# Patient Record
Sex: Male | Born: 1950 | Race: White | Hispanic: No | Marital: Married | Smoking: Never smoker
Health system: Southern US, Community
[De-identification: ages and names within clinical notes are randomized; demographics above are authoritative.]

---

## 1998-02-26 ENCOUNTER — Emergency Department (HOSPITAL_COMMUNITY): Admission: EM | Admit: 1998-02-26 | Discharge: 1998-02-26 | Payer: Self-pay | Admitting: Emergency Medicine

## 1998-04-03 ENCOUNTER — Encounter: Admission: RE | Admit: 1998-04-03 | Discharge: 1998-07-02 | Payer: Self-pay

## 2016-03-06 ENCOUNTER — Encounter: Payer: Self-pay | Admitting: General Practice

## 2016-04-26 ENCOUNTER — Ambulatory Visit: Payer: Self-pay | Admitting: Orthopedic Surgery

## 2016-05-06 ENCOUNTER — Ambulatory Visit: Payer: BC Managed Care – PPO | Admitting: Family Medicine

## 2016-05-15 ENCOUNTER — Inpatient Hospital Stay: Admit: 2016-05-15 | Payer: Self-pay | Admitting: Orthopedic Surgery

## 2016-05-15 SURGERY — ARTHROPLASTY, HIP, TOTAL, ANTERIOR APPROACH
Anesthesia: Choice | Laterality: Left

## 2017-12-25 ENCOUNTER — Inpatient Hospital Stay (HOSPITAL_COMMUNITY): Payer: Medicare Other

## 2017-12-25 ENCOUNTER — Inpatient Hospital Stay (HOSPITAL_COMMUNITY)
Admission: EM | Admit: 2017-12-25 | Discharge: 2018-01-21 | DRG: 308 | Disposition: E | Payer: Medicare Other | Attending: Pulmonary Disease | Admitting: Pulmonary Disease

## 2017-12-25 ENCOUNTER — Emergency Department (HOSPITAL_COMMUNITY): Payer: Medicare Other

## 2017-12-25 DIAGNOSIS — E1165 Type 2 diabetes mellitus with hyperglycemia: Secondary | ICD-10-CM | POA: Diagnosis present

## 2017-12-25 DIAGNOSIS — E872 Acidosis: Secondary | ICD-10-CM | POA: Diagnosis present

## 2017-12-25 DIAGNOSIS — I4901 Ventricular fibrillation: Secondary | ICD-10-CM | POA: Diagnosis present

## 2017-12-25 DIAGNOSIS — R402112 Coma scale, eyes open, never, at arrival to emergency department: Secondary | ICD-10-CM | POA: Diagnosis present

## 2017-12-25 DIAGNOSIS — G9341 Metabolic encephalopathy: Secondary | ICD-10-CM | POA: Diagnosis present

## 2017-12-25 DIAGNOSIS — R739 Hyperglycemia, unspecified: Secondary | ICD-10-CM | POA: Insufficient documentation

## 2017-12-25 DIAGNOSIS — Z66 Do not resuscitate: Secondary | ICD-10-CM | POA: Diagnosis present

## 2017-12-25 DIAGNOSIS — G40901 Epilepsy, unspecified, not intractable, with status epilepticus: Secondary | ICD-10-CM | POA: Diagnosis present

## 2017-12-25 DIAGNOSIS — J9690 Respiratory failure, unspecified, unspecified whether with hypoxia or hypercapnia: Secondary | ICD-10-CM

## 2017-12-25 DIAGNOSIS — E119 Type 2 diabetes mellitus without complications: Secondary | ICD-10-CM

## 2017-12-25 DIAGNOSIS — R570 Cardiogenic shock: Secondary | ICD-10-CM | POA: Diagnosis present

## 2017-12-25 DIAGNOSIS — E876 Hypokalemia: Secondary | ICD-10-CM | POA: Diagnosis present

## 2017-12-25 DIAGNOSIS — R402312 Coma scale, best motor response, none, at arrival to emergency department: Secondary | ICD-10-CM | POA: Diagnosis present

## 2017-12-25 DIAGNOSIS — J9600 Acute respiratory failure, unspecified whether with hypoxia or hypercapnia: Secondary | ICD-10-CM | POA: Diagnosis not present

## 2017-12-25 DIAGNOSIS — J9601 Acute respiratory failure with hypoxia: Secondary | ICD-10-CM | POA: Diagnosis present

## 2017-12-25 DIAGNOSIS — N179 Acute kidney failure, unspecified: Secondary | ICD-10-CM | POA: Diagnosis present

## 2017-12-25 DIAGNOSIS — Z515 Encounter for palliative care: Secondary | ICD-10-CM | POA: Diagnosis present

## 2017-12-25 DIAGNOSIS — Z4659 Encounter for fitting and adjustment of other gastrointestinal appliance and device: Secondary | ICD-10-CM

## 2017-12-25 DIAGNOSIS — R402212 Coma scale, best verbal response, none, at arrival to emergency department: Secondary | ICD-10-CM | POA: Diagnosis present

## 2017-12-25 DIAGNOSIS — D72829 Elevated white blood cell count, unspecified: Secondary | ICD-10-CM | POA: Diagnosis present

## 2017-12-25 DIAGNOSIS — G931 Anoxic brain damage, not elsewhere classified: Secondary | ICD-10-CM

## 2017-12-25 DIAGNOSIS — I469 Cardiac arrest, cause unspecified: Secondary | ICD-10-CM | POA: Diagnosis not present

## 2017-12-25 LAB — BASIC METABOLIC PANEL
ANION GAP: 14 (ref 5–15)
ANION GAP: 13 (ref 5–15)
BUN: 21 mg/dL — AB (ref 6–20)
BUN: 21 mg/dL — AB (ref 6–20)
CHLORIDE: 105 mmol/L (ref 101–111)
CHLORIDE: 106 mmol/L (ref 101–111)
CO2: 17 mmol/L — ABNORMAL LOW (ref 22–32)
CO2: 17 mmol/L — ABNORMAL LOW (ref 22–32)
Calcium: 8.2 mg/dL — ABNORMAL LOW (ref 8.9–10.3)
Calcium: 8.1 mg/dL — ABNORMAL LOW (ref 8.9–10.3)
Creatinine, Ser: 1.73 mg/dL — ABNORMAL HIGH (ref 0.61–1.24)
Creatinine, Ser: 1.41 mg/dL — ABNORMAL HIGH (ref 0.61–1.24)
GFR calc Af Amer: 46 mL/min — ABNORMAL LOW (ref >60)
GFR calc Af Amer: 59 mL/min — ABNORMAL LOW (ref >60)
GFR calc non Af Amer: 40 mL/min — ABNORMAL LOW (ref >60)
GFR calc non Af Amer: 51 mL/min — ABNORMAL LOW (ref >60)
GLUCOSE: 227 mg/dL — AB (ref 65–99)
Glucose, Bld: 242 mg/dL — ABNORMAL HIGH (ref 65–99)
POTASSIUM: 3.5 mmol/L (ref 3.5–5.1)
POTASSIUM: 3.2 mmol/L — AB (ref 3.5–5.1)
SODIUM: 136 mmol/L (ref 135–145)
SODIUM: 136 mmol/L (ref 135–145)

## 2017-12-25 LAB — APTT
APTT: 26 s (ref 24–36)
APTT: 27 s (ref 24–36)

## 2017-12-25 LAB — URINALYSIS, ROUTINE W REFLEX MICROSCOPIC
BILIRUBIN URINE: NEGATIVE
Glucose, UA: NEGATIVE mg/dL
Ketones, ur: NEGATIVE mg/dL
Leukocytes, UA: NEGATIVE
NITRITE: NEGATIVE
PROTEIN: NEGATIVE mg/dL
pH: 5 (ref 5.0–8.0)

## 2017-12-25 LAB — PROTIME-INR
INR: 1.03
INR: 1.03
Prothrombin Time: 13.4 seconds (ref 11.4–15.2)
Prothrombin Time: 13.4 seconds (ref 11.4–15.2)

## 2017-12-25 LAB — POCT I-STAT 3, ART BLOOD GAS (G3+)
Acid-base deficit: 9 mmol/L — ABNORMAL HIGH (ref 0.0–2.0)
Bicarbonate: 19 mmol/L — ABNORMAL LOW (ref 20.0–28.0)
O2 SAT: 97 %
PH ART: 7.241 — AB (ref 7.350–7.450)
TCO2: 20 mmol/L — ABNORMAL LOW (ref 22–32)
pCO2 arterial: 43.8 mmHg (ref 32.0–48.0)
pO2, Arterial: 101 mmHg (ref 83.0–108.0)

## 2017-12-25 LAB — URINALYSIS, MICROSCOPIC (REFLEX): WBC UA: NONE SEEN WBC/hpf (ref 0–5)

## 2017-12-25 LAB — CBC WITH DIFFERENTIAL/PLATELET
BASOS ABS: 0 10*3/uL (ref 0.0–0.1)
Basophils Relative: 0 %
Eosinophils Absolute: 0.1 10*3/uL (ref 0.0–0.7)
Eosinophils Relative: 1 %
HEMATOCRIT: 45.1 % (ref 39.0–52.0)
Hemoglobin: 15.1 g/dL (ref 13.0–17.0)
Lymphocytes Relative: 40 %
Lymphs Abs: 4.8 10*3/uL — ABNORMAL HIGH (ref 0.7–4.0)
MCH: 31.5 pg (ref 26.0–34.0)
MCHC: 33.5 g/dL (ref 30.0–36.0)
MCV: 94 fL (ref 78.0–100.0)
MONO ABS: 0.6 10*3/uL (ref 0.1–1.0)
Monocytes Relative: 5 %
NEUTROS ABS: 6.6 10*3/uL (ref 1.7–7.7)
Neutrophils Relative %: 54 %
PLATELETS: 238 10*3/uL (ref 150–400)
RBC: 4.8 MIL/uL (ref 4.22–5.81)
RDW: 13.5 % (ref 11.5–15.5)
WBC: 12.2 10*3/uL — ABNORMAL HIGH (ref 4.0–10.5)

## 2017-12-25 LAB — COMPREHENSIVE METABOLIC PANEL
ALT: 29 U/L (ref 17–63)
AST: 45 U/L — AB (ref 15–41)
Albumin: 2.9 g/dL — ABNORMAL LOW (ref 3.5–5.0)
Alkaline Phosphatase: 71 U/L (ref 38–126)
Anion gap: 15 (ref 5–15)
BILIRUBIN TOTAL: 0.9 mg/dL (ref 0.3–1.2)
BUN: 18 mg/dL (ref 6–20)
CHLORIDE: 111 mmol/L (ref 101–111)
CO2: 14 mmol/L — ABNORMAL LOW (ref 22–32)
Calcium: 7.1 mg/dL — ABNORMAL LOW (ref 8.9–10.3)
Creatinine, Ser: 1.74 mg/dL — ABNORMAL HIGH (ref 0.61–1.24)
GFR calc Af Amer: 46 mL/min — ABNORMAL LOW (ref >60)
GFR, EST NON AFRICAN AMERICAN: 39 mL/min — AB (ref >60)
Glucose, Bld: 193 mg/dL — ABNORMAL HIGH (ref 65–99)
POTASSIUM: 3.6 mmol/L (ref 3.5–5.1)
Sodium: 140 mmol/L (ref 135–145)
TOTAL PROTEIN: 4.6 g/dL — AB (ref 6.5–8.1)

## 2017-12-25 LAB — LIPID PANEL
CHOL/HDL RATIO: 4.2 ratio
CHOLESTEROL: 146 mg/dL (ref 0–200)
HDL: 35 mg/dL — ABNORMAL LOW (ref >40)
LDL Cholesterol: 75 mg/dL (ref 0–99)
TRIGLYCERIDES: 182 mg/dL — AB (ref <150)
VLDL: 36 mg/dL (ref 0–40)

## 2017-12-25 LAB — GLUCOSE, CAPILLARY
GLUCOSE-CAPILLARY: 230 mg/dL — AB (ref 65–99)
GLUCOSE-CAPILLARY: 240 mg/dL — AB (ref 65–99)
Glucose-Capillary: 145 mg/dL — ABNORMAL HIGH (ref 65–99)
Glucose-Capillary: 212 mg/dL — ABNORMAL HIGH (ref 65–99)

## 2017-12-25 LAB — I-STAT CHEM 8, ED
BUN: 20 mg/dL (ref 6–20)
CREATININE: 1.8 mg/dL — AB (ref 0.61–1.24)
Calcium, Ion: 1.07 mmol/L — ABNORMAL LOW (ref 1.15–1.40)
Chloride: 104 mmol/L (ref 101–111)
GLUCOSE: 226 mg/dL — AB (ref 65–99)
HEMATOCRIT: 47 % (ref 39.0–52.0)
HEMOGLOBIN: 16 g/dL (ref 13.0–17.0)
Potassium: 3.7 mmol/L (ref 3.5–5.1)
Sodium: 138 mmol/L (ref 135–145)
TCO2: 17 mmol/L — AB (ref 22–32)

## 2017-12-25 LAB — MAGNESIUM: Magnesium: 2.1 mg/dL (ref 1.7–2.4)

## 2017-12-25 LAB — POCT I-STAT, CHEM 8
BUN: 22 mg/dL — ABNORMAL HIGH (ref 6–20)
Calcium, Ion: 1.19 mmol/L (ref 1.15–1.40)
Chloride: 104 mmol/L (ref 101–111)
Creatinine, Ser: 1.6 mg/dL — ABNORMAL HIGH (ref 0.61–1.24)
Glucose, Bld: 235 mg/dL — ABNORMAL HIGH (ref 65–99)
HEMATOCRIT: 45 % (ref 39.0–52.0)
HEMOGLOBIN: 15.3 g/dL (ref 13.0–17.0)
Potassium: 3.9 mmol/L (ref 3.5–5.1)
SODIUM: 140 mmol/L (ref 135–145)
TCO2: 20 mmol/L — AB (ref 22–32)

## 2017-12-25 LAB — I-STAT TROPONIN, ED: TROPONIN I, POC: 0.07 ng/mL (ref 0.00–0.08)

## 2017-12-25 LAB — TROPONIN I
TROPONIN I: 0.59 ng/mL — AB (ref <0.03)
TROPONIN I: 0.48 ng/mL — AB (ref <0.03)
Troponin I: 0.06 ng/mL (ref <0.03)
Troponin I: 0.54 ng/mL (ref <0.03)

## 2017-12-25 LAB — LACTIC ACID, PLASMA: LACTIC ACID, VENOUS: 2.6 mmol/L — AB (ref 0.5–1.9)

## 2017-12-25 LAB — I-STAT CG4 LACTIC ACID, ED: LACTIC ACID, VENOUS: 8.4 mmol/L — AB (ref 0.5–1.9)

## 2017-12-25 LAB — BRAIN NATRIURETIC PEPTIDE: B NATRIURETIC PEPTIDE 5: 40.9 pg/mL (ref 0.0–100.0)

## 2017-12-25 LAB — MRSA PCR SCREENING: MRSA BY PCR: NEGATIVE

## 2017-12-25 LAB — TRIGLYCERIDES: TRIGLYCERIDES: 146 mg/dL (ref <150)

## 2017-12-25 MED ORDER — HEPARIN SODIUM (PORCINE) 5000 UNIT/ML IJ SOLN
INTRAMUSCULAR | Status: AC
  Filled 2017-12-25: qty 1

## 2017-12-25 MED ORDER — ASPIRIN 300 MG RE SUPP: 300.0000 mg | RECTAL | Status: AC

## 2017-12-25 MED ORDER — PROPOFOL 1000 MG/100ML IV EMUL
0.0000 ug/kg/min | INTRAVENOUS | Status: DC
  Administered 2017-12-25: 15 ug/kg/min via INTRAVENOUS

## 2017-12-25 MED ORDER — MIDAZOLAM HCL 2 MG/2ML IJ SOLN: 1.0000 mg | INTRAMUSCULAR | Status: DC | PRN

## 2017-12-25 MED ORDER — FENTANYL CITRATE (PF) 100 MCG/2ML IJ SOLN: 50.0000 ug | Freq: Once | INTRAMUSCULAR | Status: DC

## 2017-12-25 MED ORDER — FENTANYL BOLUS VIA INFUSION
25.0000 ug | INTRAVENOUS | Status: DC | PRN
  Filled 2017-12-25: qty 25

## 2017-12-25 MED ORDER — AMIODARONE LOAD VIA INFUSION
150.0000 mg | Freq: Once | INTRAVENOUS | Status: DC
  Filled 2017-12-25: qty 83.34

## 2017-12-25 MED ORDER — AMIODARONE HCL IN DEXTROSE 360-4.14 MG/200ML-% IV SOLN: 60.0000 mg/h | INTRAVENOUS | Status: DC

## 2017-12-25 MED ORDER — SODIUM CHLORIDE 0.9 % IV SOLN: INTRAVENOUS | Status: DC | PRN

## 2017-12-25 MED ORDER — FENTANYL 2500MCG IN NS 250ML (10MCG/ML) PREMIX INFUSION
100.0000 ug/h | INTRAVENOUS | Status: DC
  Administered 2017-12-25: 200 ug/h via INTRAVENOUS
  Administered 2017-12-26 (×2): 225 ug/h via INTRAVENOUS
  Administered 2017-12-27: 200 ug/h via INTRAVENOUS
  Administered 2017-12-28: 100 ug/h via INTRAVENOUS
  Filled 2017-12-25 (×5): qty 250

## 2017-12-25 MED ORDER — MIDAZOLAM HCL 2 MG/2ML IJ SOLN: 1.0000 mg | Freq: Once | INTRAMUSCULAR | Status: DC

## 2017-12-25 MED ORDER — HEPARIN SODIUM (PORCINE) 5000 UNIT/ML IJ SOLN: 60.0000 [IU]/kg | Freq: Once | INTRAMUSCULAR | Status: DC

## 2017-12-25 MED ORDER — ARTIFICIAL TEARS OPHTHALMIC OINT
1.0000 "application " | TOPICAL_OINTMENT | Freq: Three times a day (TID) | OPHTHALMIC | Status: DC
  Administered 2017-12-25 – 2017-12-27 (×5): 1 via OPHTHALMIC
  Filled 2017-12-25: qty 3.5

## 2017-12-25 MED ORDER — FENTANYL CITRATE (PF) 100 MCG/2ML IJ SOLN: 50.0000 ug | INTRAMUSCULAR | Status: DC | PRN

## 2017-12-25 MED ORDER — PANTOPRAZOLE SODIUM 40 MG IV SOLR
40.0000 mg | Freq: Every day | INTRAVENOUS | Status: DC
  Administered 2017-12-25 – 2017-12-27 (×3): 40 mg via INTRAVENOUS
  Filled 2017-12-25 (×3): qty 40

## 2017-12-25 MED ORDER — AMIODARONE HCL IN DEXTROSE 360-4.14 MG/200ML-% IV SOLN
60.0000 mg/h | INTRAVENOUS | Status: DC
  Administered 2017-12-25 (×2): 60 mg/h via INTRAVENOUS
  Filled 2017-12-25: qty 200

## 2017-12-25 MED ORDER — SODIUM CHLORIDE 0.9 % IV SOLN
INTRAVENOUS | Status: DC
  Administered 2017-12-25: 1.5 [IU]/h via INTRAVENOUS
  Filled 2017-12-25 (×2): qty 1

## 2017-12-25 MED ORDER — POTASSIUM CHLORIDE 10 MEQ/50ML IV SOLN
10.0000 meq | INTRAVENOUS | Status: AC
  Administered 2017-12-25 (×2): 10 meq via INTRAVENOUS
  Filled 2017-12-25 (×2): qty 50

## 2017-12-25 MED ORDER — INSULIN ASPART 100 UNIT/ML ~~LOC~~ SOLN: 0.0000 [IU] | Freq: Three times a day (TID) | SUBCUTANEOUS | Status: DC

## 2017-12-25 MED ORDER — SODIUM CHLORIDE 0.9 % IV SOLN
1.0000 ug/kg/min | INTRAVENOUS | Status: DC
  Administered 2017-12-25 – 2017-12-26 (×2): 1.2 ug/kg/min via INTRAVENOUS
  Filled 2017-12-25 (×2): qty 20

## 2017-12-25 MED ORDER — SODIUM CHLORIDE 0.9 % IV SOLN: INTRAVENOUS | Status: DC

## 2017-12-25 MED ORDER — AMIODARONE HCL IN DEXTROSE 360-4.14 MG/200ML-% IV SOLN
30.0000 mg/h | INTRAVENOUS | Status: DC
  Administered 2017-12-26: 30 mg/h via INTRAVENOUS
  Filled 2017-12-25: qty 200

## 2017-12-25 MED ORDER — SODIUM CHLORIDE 0.9% FLUSH
10.0000 mL | Freq: Two times a day (BID) | INTRAVENOUS | Status: DC
  Administered 2017-12-25 – 2017-12-28 (×6): 10 mL

## 2017-12-25 MED ORDER — MIDAZOLAM BOLUS VIA INFUSION
1.0000 mg | INTRAVENOUS | Status: DC | PRN
  Filled 2017-12-25: qty 1

## 2017-12-25 MED ORDER — CHLORHEXIDINE GLUCONATE CLOTH 2 % EX PADS
6.0000 | MEDICATED_PAD | Freq: Every day | CUTANEOUS | Status: DC
  Administered 2017-12-26 – 2017-12-28 (×3): 6 via TOPICAL

## 2017-12-25 MED ORDER — CISATRACURIUM BOLUS VIA INFUSION
0.1000 mg/kg | Freq: Once | INTRAVENOUS | Status: AC
  Administered 2017-12-25: 11.3 mg via INTRAVENOUS
  Filled 2017-12-25: qty 12

## 2017-12-25 MED ORDER — HEPARIN BOLUS VIA INFUSION
2500.0000 [IU] | Freq: Once | INTRAVENOUS | Status: AC
  Administered 2017-12-25: 2500 [IU] via INTRAVENOUS
  Filled 2017-12-25: qty 2500

## 2017-12-25 MED ORDER — NOREPINEPHRINE BITARTRATE 1 MG/ML IV SOLN
0.0000 ug/min | INTRAVENOUS | Status: DC
  Administered 2017-12-25: 5 ug/min via INTRAVENOUS
  Administered 2017-12-26: 16 ug/min via INTRAVENOUS
  Filled 2017-12-25 (×2): qty 4

## 2017-12-25 MED ORDER — SODIUM CHLORIDE 0.9 % IV SOLN
INTRAVENOUS | Status: DC
  Administered 2017-12-25: 19:00:00 via INTRAVENOUS

## 2017-12-25 MED ORDER — ASPIRIN 300 MG RE SUPP
300.0000 mg | RECTAL | Status: AC
  Administered 2017-12-25: 300 mg via RECTAL
  Filled 2017-12-25: qty 1

## 2017-12-25 MED ORDER — HEPARIN (PORCINE) IN NACL 100-0.45 UNIT/ML-% IJ SOLN
1000.0000 [IU]/h | INTRAMUSCULAR | Status: DC
  Administered 2017-12-25: 800 [IU]/h via INTRAVENOUS
  Administered 2017-12-26 – 2017-12-27 (×2): 1000 [IU]/h via INTRAVENOUS
  Filled 2017-12-25 (×3): qty 250

## 2017-12-25 MED ORDER — SODIUM CHLORIDE 0.9 % IV SOLN
2.0000 mg/h | INTRAVENOUS | Status: DC
  Administered 2017-12-25 – 2017-12-26 (×2): 2 mg/h via INTRAVENOUS
  Filled 2017-12-25 (×2): qty 10

## 2017-12-25 MED ORDER — PROPOFOL 1000 MG/100ML IV EMUL
INTRAVENOUS | Status: AC
  Filled 2017-12-25: qty 100

## 2017-12-25 MED ORDER — CISATRACURIUM BOLUS VIA INFUSION
0.0500 mg/kg | INTRAVENOUS | Status: DC | PRN
  Filled 2017-12-25: qty 6

## 2017-12-25 MED ORDER — AMIODARONE HCL IN DEXTROSE 360-4.14 MG/200ML-% IV SOLN: 30.0000 mg/h | INTRAVENOUS | Status: DC

## 2017-12-25 MED ORDER — ORAL CARE MOUTH RINSE
15.0000 mL | OROMUCOSAL | Status: DC
  Administered 2017-12-26 – 2017-12-28 (×25): 15 mL via OROMUCOSAL

## 2017-12-25 MED ORDER — CHLORHEXIDINE GLUCONATE 0.12% ORAL RINSE (MEDLINE KIT)
15.0000 mL | Freq: Two times a day (BID) | OROMUCOSAL | Status: DC
  Administered 2017-12-25 – 2017-12-28 (×6): 15 mL via OROMUCOSAL

## 2017-12-25 MED ORDER — SODIUM CHLORIDE 0.9% FLUSH: 10.0000 mL | INTRAVENOUS | Status: DC | PRN

## 2017-12-25 MED ORDER — ASPIRIN 81 MG PO CHEW: 324.0000 mg | CHEWABLE_TABLET | Freq: Once | ORAL | Status: DC

## 2017-12-25 NOTE — ED Triage Notes (Signed)
See code narrator 

## 2017-12-25 NOTE — Plan of Care (Signed)
  Problem: Education: Goal: Knowledge of General Education information will improve Outcome: Progressing   Problem: Health Behavior/Discharge Planning: Goal: Ability to manage health-related needs will improve Outcome: Progressing   Problem: Clinical Measurements: Goal: Ability to maintain clinical measurements within normal limits will improve Outcome: Progressing Goal: Will remain free from infection Outcome: Progressing Goal: Diagnostic test results will improve Outcome: Progressing Goal: Respiratory complications will improve Outcome: Progressing Goal: Cardiovascular complication will be avoided Outcome: Progressing   Problem: Activity: Goal: Risk for activity intolerance will decrease Outcome: Progressing   Problem: Coping: Goal: Level of anxiety will decrease Outcome: Progressing   Problem: Elimination: Goal: Will not experience complications related to bowel motility Outcome: Progressing Goal: Will not experience complications related to urinary retention Outcome: Progressing   Problem: Pain Managment: Goal: General experience of comfort will improve Outcome: Progressing   Problem: Safety: Goal: Ability to remain free from injury will improve Outcome: Progressing   Problem: Skin Integrity: Goal: Risk for impaired skin integrity will decrease Outcome: Progressing   Problem: Education: Goal: Ability to manage disease process will improve Outcome: Progressing   Problem: Cardiac: Goal: Ability to achieve and maintain adequate cardiopulmonary perfusion will improve Outcome: Progressing   Problem: Neurologic: Goal: Promote progressive neurologic recovery Outcome: Progressing   Problem: Skin Integrity: Goal: Risk for impaired skin integrity will be minimized. Outcome: Progressing

## 2017-12-25 NOTE — Progress Notes (Signed)
Notified ELink of troponin 0.54, K 3.5, and two orders for maintenance fluids. New orders given.

## 2017-12-25 NOTE — ED Notes (Signed)
Paged Stemi Cards per MD Roger Shelterong

## 2017-12-25 NOTE — Code Documentation (Signed)
CCM resident at bedside

## 2017-12-25 NOTE — ED Provider Notes (Signed)
[facility] Provider Note   CSN: [id number] Arrival date & time: 12/27/2017  1637     History   Chief Complaint Chief Complaint  Patient presents with  . post CPR    HPI Devin [name] is a 67 y.o. male.  The history is provided by the patient.  Cardiac Arrest  Witnessed by:  Not witnessed Incident location:  Home Time since incident:  25 minutes Time before BLS initiated: 3. Time before ALS initiated:  5-8 minutes Condition upon EMS arrival:  Unresponsive Pulse:  Absent Initial cardiac rhythm per EMS:  PEA Treatments prior to arrival:  ACLS protocol, AED discharged, intubation, CCR and vascular access Medications given prior to ED:  Epinephrine Defibrillation prior to arrival:  300 J Number of shocks delivered:  2 Defibrillation successful: yes   Rhythm after defibrillation:  Normal sinus rhythm IV access type:  Peripheral and intraosseous Airway:  Intubation prior to arrival Rhythm on admission to ED:  Normal sinus Associated symptoms: chest pain and shortness of breath   Risk factors: diabetes mellitus   reportedly 5d of CP and SOB leading up today  No past medical history on file.  Patient Active Problem List   Diagnosis Date Noted  . Cardiac arrest (HCC) 12/27/2017  . Diabetes (HCC) 01/14/2018    No past surgical history on file.      Home Medications    Prior to Admission medications   Not on File    Family History No family history on file.  Social History Social History   Tobacco Use  . Smoking status: Never Smoker  . Smokeless tobacco: Never Used  Substance Use Topics  . Alcohol use: Not on file  . Drug use: No     Allergies   Patient has no known allergies.   Review of Systems Review of Systems  Unable to perform ROS: Patient unresponsive  Respiratory: Positive for shortness of breath.   Cardiovascular: Positive for chest pain.  Level 5 caveat: Cardiac arrest   Physical  Exam Updated Vital Signs BP 124/74   Pulse 91   Temp (!) 96.9 F (36.1 C) (Temporal)   Resp 16   Ht 6' (1.829 m)   Wt 113.4 kg (250 lb)   SpO2 98%   BMI 33.91 kg/m   Physical Exam  Constitutional: He appears well-developed and well-nourished.  HENT:  Head: Normocephalic and atraumatic.  Eyes: Conjunctivae are normal.  3mm pupils, minimal reaction to light  Neck: Neck supple.  Cardiovascular: Normal rate, regular rhythm and intact distal pulses.  Pulmonary/Chest: Effort normal. No respiratory distress. He has wheezes.  ET tube in place.  Bilateral breath sounds present on arrival.  Abdominal: Soft. He exhibits no distension. There is no tenderness. There is no guarding.  Musculoskeletal: He exhibits no edema.  Neurological: He is unresponsive. GCS eye subscore is 1. GCS verbal subscore is 1. GCS motor subscore is 1.  No reaction to painful stimuli.  Patient does bite on the tube and is spontaneously breathing.  Skin: Skin is warm and dry.  Psychiatric: He has a normal mood and affect.  Nursing note and vitals reviewed.    ED Treatments / Results  Labs (all labs ordered are listed, but only abnormal results are displayed) Labs Reviewed  CBC WITH DIFFERENTIAL/PLATELET - Abnormal; Notable for the following components:      Result Value   WBC 12.2 (*)    Lymphs Abs 4.8 (*)    All other  components within normal limits  I-STAT CHEM 8, ED - Abnormal; Notable for the following components:   Creatinine, Ser 1.80 (*)    Glucose, Bld 226 (*)    Calcium, Ion 1.07 (*)    TCO2 17 (*)    All other components within normal limits  I-STAT CG4 LACTIC ACID, ED - Abnormal; Notable for the following components:   Lactic Acid, Venous 8.40 (*)    All other components within normal limits  PROTIME-INR  APTT  COMPREHENSIVE METABOLIC PANEL  LIPID PANEL  MAGNESIUM  TROPONIN I  TROPONIN I  TROPONIN I  BRAIN NATRIURETIC PEPTIDE  URINALYSIS, ROUTINE W REFLEX MICROSCOPIC  BLOOD GAS,  ARTERIAL  TRIGLYCERIDES  HIV ANTIBODY (ROUTINE TESTING)  TROPONIN I  TROPONIN I  TROPONIN I  BASIC METABOLIC PANEL  BASIC METABOLIC PANEL  BASIC METABOLIC PANEL  BASIC METABOLIC PANEL  BASIC METABOLIC PANEL  BASIC METABOLIC PANEL  BASIC METABOLIC PANEL  PROTIME-INR  PROTIME-INR  APTT  APTT  BLOOD GAS, ARTERIAL  BLOOD GAS, ARTERIAL  I-STAT TROPONIN, ED  I-STAT ARTERIAL BLOOD GAS, ED    EKG EKG Interpretation  Date/Time:  Thursday December 25 2017 16:50:15 EDT Ventricular Rate:  99 PR Interval:    QRS Duration: 118 QT Interval:  369 QTC Calculation: 474 R Axis:   15 Text Interpretation:  Sinus rhythm Incomplete right bundle branch block No acute changes no new changes - posterior EKG Confirmed by Derwood Kaplan ([id number]) on 12/27/2017 5:32:53 PM   Radiology Dg Chest Port 1 View  Result Date: 12/26/2017 CLINICAL DATA:  67 y/o  M; post cardiac arrest.  ET tube placement. EXAM: PORTABLE CHEST 1 VIEW COMPARISON:  None. FINDINGS: Stable cardiac silhouette given projection and technique. Low lung volumes. No consolidation, effusion, or pneumothorax identified. Endotracheal tube tip projects 3.7 cm above the carina. Left lung partially obscured by transcutaneous pacing pads. No acute osseous abnormality identified. IMPRESSION: Endotracheal tube 3.7 cm above the carina. Low lung volumes. No acute pulmonary process identified. Electronically Signed   By: Mitzi Hansen M.D.   On: 01/12/2018 17:02    Procedures .Central Line Date/Time: 01/15/2018 5:57 PM Performed by: Dwana Melena, DO Authorized by: Derwood Kaplan, MD   Consent:    Consent obtained:  Emergent situation Pre-procedure details:    Skin preparation:  ChloraPrep Procedure details:    Location:  R femoral   Site selection rationale:  In C collar and did not want to risk pneumothorax with subclavian CVC.   Patient position:  Flat   Procedural supplies:  Triple lumen   Landmarks identified: yes     Ultrasound  guidance: yes     Sterile ultrasound techniques: Sterile gel and sterile probe covers were used     Number of attempts:  1   Successful placement: yes   Post-procedure details:    Post-procedure:  Dressing applied and line sutured   Assessment:  Blood return through all ports and free fluid flow   Patient tolerance of procedure:  Tolerated well, no immediate complications   (including critical care time)  Medications Ordered in ED Medications  0.9 %  sodium chloride infusion (has no administration in time range)  amiodarone (NEXTERONE) 1.8 mg/mL load via infusion 150 mg (has no administration in time range)    Followed by  amiodarone (NEXTERONE PREMIX) 360-4.14 MG/200ML-% (1.8 mg/mL) IV infusion (60 mg/hr Intravenous New Bag/Given 12/30/2017 1653)    Followed by  amiodarone (NEXTERONE PREMIX) 360-4.14 MG/200ML-% (1.8 mg/mL) IV infusion (has no administration  in time range)  heparin 5000 UNIT/ML injection (has no administration in time range)  aspirin suppository 300 mg (has no administration in time range)  fentaNYL (SUBLIMAZE) injection 50 mcg (has no administration in time range)  fentaNYL (SUBLIMAZE) injection 50 mcg (has no administration in time range)  0.9 %  sodium chloride infusion (has no administration in time range)  propofol (DIPRIVAN) 1000 MG/100ML infusion (has no administration in time range)  propofol (DIPRIVAN) 1000 MG/100ML infusion (15 mcg/kg/min  113.4 kg Intravenous New Bag/Given 01/20/2018 1658)  midazolam (VERSED) injection 1 mg (has no administration in time range)  midazolam (VERSED) injection 1 mg (has no administration in time range)  0.9 %  sodium chloride infusion (has no administration in time range)  norepinephrine (LEVOPHED) 4 mg in dextrose 5 % 250 mL (0.016 mg/mL) infusion (has no administration in time range)  aspirin suppository 300 mg (has no administration in time range)  cisatracurium (NIMBEX) bolus via infusion 11.3 mg (has no administration in time  range)    And  cisatracurium (NIMBEX) 200 mg in sodium chloride 0.9 % 200 mL (1 mg/mL) infusion (has no administration in time range)    And  cisatracurium (NIMBEX) bolus via infusion 5.7 mg (has no administration in time range)  artificial tears (LACRILUBE) ophthalmic ointment 1 application (has no administration in time range)  0.9 %  sodium chloride infusion (has no administration in time range)  fentaNYL (SUBLIMAZE) injection 50 mcg (has no administration in time range)  fentaNYL in NS (108mcg/ml) infusion-PREMIX (has no administration in time range)  fentaNYL (SUBLIMAZE) bolus via infusion 25 mcg (has no administration in time range)  midazolam (VERSED) injection 1 mg (has no administration in time range)  midazolam (VERSED) 50 mg in sodium chloride 0.9 % 50 mL (1 mg/mL) infusion (has no administration in time range)  midazolam (VERSED) bolus via infusion 1 mg (has no administration in time range)  pantoprazole (PROTONIX) injection 40 mg (has no administration in time range)     Initial Impression / Assessment and Plan / ED Course  I have reviewed the triage vital signs and the nursing notes.  Pertinent labs & imaging results that were available during my care of the patient were reviewed by me and considered in my medical decision making (see chart for details).     Patient is a 67 year old male with history of diabetes who presents after cardiac arrest.  Patient received 2 defibrillations prior to arrival and 4 rounds of epinephrine.  Patient had return of spontaneous circulation prior to arrival.  He arrives in sinus rhythm with pulses present and bilateral breath sounds.  Patient was intubated prior to arrival.  Blood pressures are stable and normotensive on arrival.  Patient started on amiodarone due to reported ventricular fibrillation with EMS.  Patient did report 5 days of chest pain or shortness of breath prior to the arrest.  EKG initially shows global ST  depressions and ST elevation in aVR.  Patient does not have a STEMI.  I spoke with Dr. [place] with the cardiology STEMI team who does not recommend Cath Lab activation at this time.  Patient is placed on the cooling protocol due to the V. fib arrest.  ICU team agrees.  Chest x-ray confirmed ET tube placement.  CT head and CT cervical spine ordered.  Patient placed on propofol for sedation.  IV fluids given.  Respiratory therapy placed a left radial arterial line.  I placed a right femoral central line.  Patient  will be admitted to the ICU for post cardiac arrest care.  Final Clinical Impressions(s) / ED Diagnoses   Final diagnoses:  Encounter for orogastric (OG) tube placement  1. Cardiac arrest 2. Ventricular fibrillation  ED Discharge Orders    None       Dwana Melena, DO 12/22/2017 Theressa Millard, MD 12/27/17 1944

## 2017-12-25 NOTE — Code Documentation (Signed)
Pt to ED via [facility] from home with post CPR- pt has had 5 days of chest pain - pt was working inyard, collapsed

## 2017-12-25 NOTE — Consult Note (Signed)
Cardiology Consultation:   Patient ID: Devin [name]; [id number]; [birth year]   Admit date: 01/02/2018 Date of Consult: 01/14/2018  Primary Care Provider: No primary care provider on file. [facility]   Patient Profile:   Devin [name] is a 67 y.o. male with a reported hx of DM who is being seen today for the evaluation of out of hospital cardiac arrest,  at the request of Dr. Rhunette CroftNanavati, Emergency Medicine.  History of Present Illness:   Mr. Jefm BryantClemmons [name] has a reported h/o DM. A friend who was with him today reports that the patient has been complaining of intermittent chest pain for the past 5 days.   Per EMS report, Pt's friend was with him today and they were doing some work. The friend stepped a way for <5 min and when he returned, pt was down and unresponsive. EMS was called and bystander CPR was initiated. Upon EMS arrival, he was found to have a shockable rhythm and he received 300 J defibrillation. CPR continued and ROSC was achieved. [facility]. Post defibrillation EKGs shows lateral TWIs + AVR elevation. Pt is intubated and cooling protocol is being initiated. He will be [facility]. No plans for emergent cath at this time.   No past medical history on file.  No past surgical history on file.   Home Medications:  Prior to Admission medications   Not on File    Inpatient Medications: Scheduled Meds: . amiodarone  150 mg Intravenous Once  . artificial tears  1 application Both Eyes Q8H  . aspirin  300 mg Rectal NOW  . aspirin  300 mg Rectal NOW  . cisatracurium  0.1 mg/kg Intravenous Once  . fentaNYL (SUBLIMAZE) injection  50 mcg Intravenous Once  . heparin      . midazolam  1 mg Intravenous Once  . pantoprazole (PROTONIX) IV  40 mg Intravenous QHS   Continuous Infusions: . sodium chloride    . sodium chloride    . sodium chloride    . sodium chloride    . amiodarone 60 mg/hr (August 15, 2018 1653)   Followed by    . amiodarone    . cisatracurium (NIMBEX) infusion    . fentaNYL infusion INTRAVENOUS    . midazolam (VERSED) infusion    . norepinephrine (LEVOPHED) Adult infusion    . propofol    . propofol (DIPRIVAN) infusion 15 mcg/kg/min (August 15, 2018 1658)   PRN Meds: Place/Maintain arterial line **AND** sodium chloride, cisatracurium **AND** cisatracurium (NIMBEX) infusion **AND** cisatracurium, fentaNYL, fentaNYL (SUBLIMAZE) injection, fentaNYL (SUBLIMAZE) injection, midazolam, midazolam, midazolam  Allergies:   No Known Allergies  Social History:   Social History   Socioeconomic History  . Marital status: Married    Spouse name: Not on file  . Number of children: Not on file  . Years of education: Not on file  . Highest education level: Not on file  Occupational History  . Not on file  Social Needs  . Financial resource strain: Not on file  . Food insecurity:    Worry: Not on file    Inability: Not on file  . Transportation needs:    Medical: Not on file    Non-medical: Not on file  Tobacco Use  . Smoking status: Never Smoker  . Smokeless tobacco: Never Used  Substance and Sexual Activity  . Alcohol use: Not on file  . Drug use: No  . Sexual activity: Not on file  Lifestyle  .  Physical activity:    Days per week: Not on file    Minutes per session: Not on file  . Stress: Not on file  Relationships  . Social connections:    Talks on phone: Not on file    Gets together: Not on file    Attends religious service: Not on file    Active member of club or organization: Not on file    Attends meetings of clubs or organizations: Not on file    Relationship status: Not on file  . Intimate partner violence:    Fear of current or ex partner: Not on file    Emotionally abused: Not on file    Physically abused: Not on file    Forced sexual activity: Not on file  Other Topics Concern  . Not on file  Social History Narrative  . Not on file    Family History:   No family history  on file. - unable to obtain family history given pt status. Currently intubated and sedated. No family/ friends currently present   ROS:  Please see the history of present illness.   All other ROS reviewed and negative.     Physical Exam/Data:   Vitals:   2017-12-26 1643 Dec 26, 2017 1647 2017/12/26 1715  BP:   124/74  Pulse:   91  Resp:   16  Temp:   (!) 96.9 F (36.1 C)  TempSrc:   Temporal  SpO2: 98%  98%  Weight:  250 lb (113.4 kg)   Height: 5\' 11"  (1.803 m) 6' (1.829 m)    No intake or output data in the 24 hours ending Dec 26, 2017 1749 Filed Weights   2017/12/26 1647  Weight: 250 lb (113.4 kg)   Body mass index is 33.91 kg/m.  General:  Intubated and sedated HEENT: normal Lymph: no adenopathy Neck: no JVD Endocrine:  No thryomegaly Vascular: No carotid bruits; FA pulses 2+ bilaterally without bruits  Cardiac:  normal S1, S2; RRR; no murmur  Lungs:  clear to auscultation bilaterally, no wheezing, rhonchi or rales  Abd: soft, nontender, no hepatomegaly  Ext: no edema Musculoskeletal:  No deformities, BUE and BLE strength normal and equal Skin: warm and dry  Neuro:  Intubated and sedated Psych:  Intubated and sedated.  EKG:  The EKG was personally reviewed and demonstrates: Initial EKG- Sinus rhythm Posterior infarct, recent avR ST elevation, v2, v3 and inferior lead ST depression, reperfusion c hanges vs. MI  Telemetry:  Telemetry was personally reviewed and demonstrates:    Relevant CV Studies: W/u pending   Laboratory Data:  Chemistry Recent Labs  Lab December 26, 2017 1654  NA 138  K 3.7  CL 104  GLUCOSE 226*  BUN 20  CREATININE 1.80*    No results for input(s): PROT, ALBUMIN, AST, ALT, ALKPHOS, BILITOT in the last 168 hours. Hematology Recent Labs  Lab 12/26/2017 1640 12-26-17 1654  WBC 12.2*  --   RBC 4.80  --   HGB 15.1 16.0  HCT 45.1 47.0  MCV 94.0  --   MCH 31.5  --   MCHC 33.5  --   RDW 13.5  --   PLT 238  --    Cardiac EnzymesNo results for  input(s): TROPONINI in the last 168 hours.  Recent Labs  Lab 26-Dec-2017 1648  TROPIPOC 0.07    BNPNo results for input(s): BNP, PROBNP in the last 168 hours.  DDimer No results for input(s): DDIMER in the last 168 hours.  Radiology/Studies:  Dg Chest [facility]  1 View  Result Date: 2018-01-01 CLINICAL DATA:  67 y/o  M; post cardiac arrest.  ET tube placement. EXAM: PORTABLE CHEST 1 VIEW COMPARISON:  None. FINDINGS: Stable cardiac silhouette given projection and technique. Low lung volumes. No consolidation, effusion, or pneumothorax identified. Endotracheal tube tip projects 3.7 cm above the carina. Left lung partially obscured by transcutaneous pacing pads. No acute osseous abnormality identified. IMPRESSION: Endotracheal tube 3.7 cm above the carina. Low lung volumes. No acute pulmonary process identified. Electronically Signed   By: Mitzi Hansen M.D.   On: 2018/01/01 17:02    Assessment and Plan:   1. Cardiac Arrest: per EMS reports, friend of pt noted recent history of chest pain x 5 days. Also reported h/o DM, thus CAD likely. ROSC achieved with defibrillation and CPR. Post defibrillation EKGs showed sinus rhythm w/ avR ST elevation and v2, v3 and inferior lead ST depression. Repeat EKG showed SR with incomplete RBBB. Pt is currently being cooled and intubated and will be admitted to ICU by [facility]. No plans for immediate cath. Will cycle cardiac enzymes and can get echo at some point. LHC dependent on neurological recovery. MD to follow with further recs.    For questions or updates, please contact [facility] Please consult [email or web address] for contact info under Cardiology/STEMI.   Signed, Robbie Lis, PA-C  01-Jan-2018 5:49 PM

## 2017-12-25 NOTE — Code Documentation (Signed)
Ice packs placed for code cool

## 2017-12-25 NOTE — Procedures (Signed)
Arterial Catheter Insertion Procedure Note Devin SheldonRobert A [name] [name] [id number] [birth year]  Procedure: Insertion of Arterial Catheter  Indications: Blood pressure monitoring and Frequent blood sampling  Procedure Details Consent: Unable to obtain consent because of emergent medical necessity. Time Out: Verified patient identification, verified procedure, site/side was marked, verified correct patient position, special equipment/implants available, medications/allergies/relevent history reviewed, required imaging and test results available.  Performed  Maximum sterile technique was used including antiseptics, cap, gloves, gown, hand hygiene, mask and sheet. Skin prep: Chlorhexidine; local anesthetic administered 20 gauge catheter was inserted into left radial artery using the Seldinger technique. ULTRASOUND GUIDANCE USED: NO Evaluation Blood flow good; BP tracing good. Complications: No apparent complications.   Devin [name], Devin [name] Devin [name] 12/27/2017

## 2017-12-25 NOTE — Progress Notes (Addendum)
eLink Physician-Brief Progress Note Patient Name: Devin SheldonRobert A Clemmons [name] DOB: 12-31-1950 MRN: [id number]   Date of Service  01/08/2018  HPI/Events of Note  Hypokalemia  eICU Interventions  Replace K+        Devin [name] 01/09/2018, 9:14 PM

## 2017-12-25 NOTE — Progress Notes (Signed)
Spoke with ELink at 2200 after 2 CBGs > 200. Orders for SSI placed by MD. ICU Glycemic Control Phase 2 ordered by this RN after 2300 CBG > 200.

## 2017-12-25 NOTE — H&P (Signed)
PULMONARY / CRITICAL CARE MEDICINE   Name: Devin [name] MRN: [id number] DOB: 11/23/1950    ADMISSION DATE:  01/15/2018 CONSULTATION DATE:  01/18/2018  REFERRING MD: ED  CHIEF COMPLAINT:  Cardiac arrest  HISTORY OF PRESENT ILLNESS:   [name] is a 67 yr old male with no available past history coming in after witnessed cardiac arrest. He was working in the yard with his family were he collapsed and CPR started immediately and when EMS arrived he was shocked twice. He was down for around 23mins before ROSC. Upon arrival to the ED he was in sinus rhythm but GCS3. He was started shortly in sedation for biting on the tube.  I obviously could not take any Hx from the patient and most of the history was taken from staff.  PAST MEDICAL HISTORY :  He  has no past medical history on file. Could not be obtained due to altered mental status   PAST SURGICAL HISTORY: He  has no past surgical history on file.  No Known Allergies  No current facility-administered medications on file prior to encounter.    No current outpatient medications on file prior to encounter.    FAMILY HISTORY:  His has no family status information on file.    SOCIAL HISTORY: Could not be obtained due to altered mental status    REVIEW OF SYSTEMS:   Could not be obtained due to altered mental status and no family available     VITAL SIGNS: BP 124/74   Pulse 91   Temp (!) 96.9 F (36.1 C) (Temporal)   Resp 16   Ht 6' (1.829 m)   Wt 113.4 kg (250 lb)   SpO2 98%   BMI 33.91 kg/m   HEMODYNAMICS:    VENTILATOR SETTINGS: Vent Mode: PRVC FiO2 (%):  [100 %] 100 % Set Rate:  [16 bmp] 16 bmp Vt Set:  [600 mL] 600 mL PEEP:  [5 cmH20] 5 cmH20  INTAKE / OUTPUT: No intake/output data recorded.  PHYSICAL EXAMINATION: General: obtunded acutely ill  Neuro: pupils 4mm reactive HEENT:  Intubated orally  Cardiovascular:  Normal heart sounds no murmurs Lungs:  Clear no wheezing no crackles  Abdomen:  soft no tenderness Musculoskeletal:  No edema  Skin:  No rash LABS:  BMET Recent Labs  Lab 12/27/2017 1654  NA 138  K 3.7  CL 104  BUN 20  CREATININE 1.80*  GLUCOSE 226*    Electrolytes No results for input(s): CALCIUM, MG, PHOS in the last 168 hours.  CBC Recent Labs  Lab 01/14/2018 1640 01/13/2018 1654  WBC 12.2*  --   HGB 15.1 16.0  HCT 45.1 47.0  PLT 238  --     Coag's Recent Labs  Lab 01/06/2018 1640  APTT 26  INR 1.03    Sepsis Markers Recent Labs  Lab 01/12/2018 1656  LATICACIDVEN 8.40*    ABG No results for input(s): PHART, PCO2ART, PO2ART in the last 168 hours.  Liver Enzymes No results for input(s): AST, ALT, ALKPHOS, BILITOT, ALBUMIN in the last 168 hours.  Cardiac Enzymes No results for input(s): TROPONINI, PROBNP in the last 168 hours.  Glucose No results for input(s): GLUCAP in the last 168 hours.  Imaging Dg Chest Port 1 View  Result Date: 12/24/2017 CLINICAL DATA:  67 y/o  M; post cardiac arrest.  ET tube placement. EXAM: PORTABLE CHEST 1 VIEW COMPARISON:  None. FINDINGS: Stable cardiac silhouette given projection and technique. Low lung volumes. No consolidation, effusion, or  pneumothorax identified. Endotracheal tube tip projects 3.7 cm above the carina. Left lung partially obscured by transcutaneous pacing pads. No acute osseous abnormality identified. IMPRESSION: Endotracheal tube 3.7 cm above the carina. Low lung volumes. No acute pulmonary process identified. Electronically Signed   By: Mitzi Hansen M.D.   On: 12/23/2017 17:02     STUDIES:  Ct head >>>   SIGNIFICANT EVENTS: Cardiac arrest 01/10/2018   LINES/TUBES: Right femoral line 4/4  Arterial line 4/4  DISCUSSION: [name] 67 yr old male with witnessed ventricular fibrillation arrest and acute hypoxemic respiratory failure requiring mechanical ventilation    ASSESSMENT / PLAN:  PULMONARY A: Acute hypoxemic resp failure requiring mechanical ventilation    P:   Vent protocol/bundle - daily CXR - 6cc/kg Vt  CARDIOVASCULAR A:  V fib arrest   P:  Start hypothermia protocol  Amiodarone drip  Cardiology consulted no cath for now Trend cardiac enzymes  Repeat lactic acid    RENAL A:   AKI  P:   Follow renal function   GASTROINTESTINAL A:    P:   NPO PPI for GI prophylaxis   HEMATOLOGIC A:   Leukocytosis  P:  Possible reactive   INFECTIOUS A:   No signs of infections  P:     ENDOCRINE A:   hyperglycemia P:   SSI  NEUROLOGIC A:   Acute metabolic encephalopathy  Rule out anoxic injury  P:   - stat CT head   RASS goal: -2  I have spent 45 mins of CC time bedside or in the unit exclusive of billable procedures   FAMILY  - Updates: none available   - Inter-disciplinary family meet or Palliative Care meeting due by:  12/31/2017   Pulmonary and Critical Care Medicine [facility] Pager: [phone]  12/26/2017, 5:55 PM

## 2017-12-25 NOTE — Progress Notes (Signed)
eLink Physician-Brief Progress Note Patient Name: Devin SheldonRobert A Clemmons [name] DOB: [birth year] MRN: [id number]   Date of Service  12/24/2017  HPI/Events of Note  hyperglycemia  eICU Interventions  Humalog Insulin sliding scale regimen        Migdalia DkOkoronkwo U Ogan 01/02/2018, 10:06 PM

## 2017-12-25 NOTE — Progress Notes (Signed)
Pt transported to CT for procedure and 2H unit via vent with current settings. Ambu bag and mask at bedside during transport. ETT secure and intact, pt tol well. Report given to RealitosJessie, RCP. No complications.

## 2017-12-25 NOTE — Progress Notes (Signed)
ANTICOAGULATION CONSULT NOTE - Initial Consult  Pharmacy Consult for heparin Indication: chest pain/ACS  No Known Allergies  Patient Measurements: Height: 6' (182.9 cm) Weight: 248 lb 0.3 oz (112.5 kg)(-2.39 for arctic pads) IBW/kg (Calculated) : 77.6 Heparin Dosing Weight: 100 kg   Vital Signs: Temp: 96.9 F (36.1 C) (04/04 1715) Temp Source: Temporal (04/04 1715) BP: 124/74 (04/04 1715) Pulse Rate: 91 (04/04 1715)  Labs: Recent Labs    Sep 28, 2017 1640 Sep 28, 2017 1654  HGB 15.1 16.0  HCT 45.1 47.0  PLT 238  --   APTT 26  --   LABPROT 13.4  --   INR 1.03  --   CREATININE 1.74* 1.80*  TROPONINI 0.06*  --     Estimated Creatinine Clearance: 53 mL/min (A) (by C-G formula based on SCr of 1.8 mg/dL (H)).  Assessment: [age] YOM post cardiac arrest on [place] cooling protocol, pharmacy is consulted to start IV heparin for ACS, pt with mild troponin elevation and ST elevation. Head-CT no bleeding. Pending neurologic improvement to make decision for cath.  hgb 15.1, pltc 238K. baseline aPTT, PT/INR wnl.   Goal of Therapy:  Heparin level 0.3-0.7 units/ml Monitor platelets by anticoagulation protocol: Yes   Plan:  - Heparin bolus 2500 units - Heparin infusion 800 units/hr - f/u 6 hr heparin level at 0300 - f/u rewarming and plan for cath  Bayard HuggerMei Shavonn Convey, PharmD, BCPS  Clinical Pharmacist  Pager: [phone]   01/07/2018,6:42 PM

## 2017-12-26 ENCOUNTER — Inpatient Hospital Stay (HOSPITAL_COMMUNITY): Payer: Medicare Other

## 2017-12-26 DIAGNOSIS — I469 Cardiac arrest, cause unspecified: Secondary | ICD-10-CM

## 2017-12-26 DIAGNOSIS — G40901 Epilepsy, unspecified, not intractable, with status epilepticus: Secondary | ICD-10-CM

## 2017-12-26 DIAGNOSIS — J9600 Acute respiratory failure, unspecified whether with hypoxia or hypercapnia: Secondary | ICD-10-CM

## 2017-12-26 DIAGNOSIS — N179 Acute kidney failure, unspecified: Secondary | ICD-10-CM

## 2017-12-26 LAB — POCT I-STAT, CHEM 8
BUN: 21 mg/dL — ABNORMAL HIGH (ref 6–20)
Calcium, Ion: 1.05 mmol/L — ABNORMAL LOW (ref 1.15–1.40)
Chloride: 105 mmol/L (ref 101–111)
Creatinine, Ser: 1.1 mg/dL (ref 0.61–1.24)
Glucose, Bld: 160 mg/dL — ABNORMAL HIGH (ref 65–99)
HEMATOCRIT: 47 % (ref 39.0–52.0)
HEMOGLOBIN: 16 g/dL (ref 13.0–17.0)
POTASSIUM: 3.5 mmol/L (ref 3.5–5.1)
SODIUM: 138 mmol/L (ref 135–145)
TCO2: 16 mmol/L — AB (ref 22–32)

## 2017-12-26 LAB — BASIC METABOLIC PANEL
ANION GAP: 10 (ref 5–15)
ANION GAP: 12 (ref 5–15)
ANION GAP: 15 (ref 5–15)
ANION GAP: 15 (ref 5–15)
Anion gap: 16 — ABNORMAL HIGH (ref 5–15)
BUN: 20 mg/dL (ref 6–20)
BUN: 21 mg/dL — ABNORMAL HIGH (ref 6–20)
BUN: 20 mg/dL (ref 6–20)
BUN: 20 mg/dL (ref 6–20)
BUN: 18 mg/dL (ref 6–20)
CALCIUM: 7.4 mg/dL — AB (ref 8.9–10.3)
CHLORIDE: 106 mmol/L (ref 101–111)
CHLORIDE: 107 mmol/L (ref 101–111)
CHLORIDE: 108 mmol/L (ref 101–111)
CO2: 14 mmol/L — AB (ref 22–32)
CO2: 14 mmol/L — ABNORMAL LOW (ref 22–32)
CO2: 14 mmol/L — ABNORMAL LOW (ref 22–32)
CO2: 17 mmol/L — AB (ref 22–32)
CO2: 17 mmol/L — ABNORMAL LOW (ref 22–32)
CREATININE: 1.37 mg/dL — AB (ref 0.61–1.24)
CREATININE: 1.33 mg/dL — AB (ref 0.61–1.24)
CREATININE: 1.34 mg/dL — AB (ref 0.61–1.24)
Calcium: 7.9 mg/dL — ABNORMAL LOW (ref 8.9–10.3)
Calcium: 7.9 mg/dL — ABNORMAL LOW (ref 8.9–10.3)
Calcium: 7.8 mg/dL — ABNORMAL LOW (ref 8.9–10.3)
Calcium: 7.4 mg/dL — ABNORMAL LOW (ref 8.9–10.3)
Chloride: 107 mmol/L (ref 101–111)
Chloride: 108 mmol/L (ref 101–111)
Creatinine, Ser: 1.46 mg/dL — ABNORMAL HIGH (ref 0.61–1.24)
Creatinine, Ser: 1.45 mg/dL — ABNORMAL HIGH (ref 0.61–1.24)
GFR calc non Af Amer: 49 mL/min — ABNORMAL LOW (ref >60)
GFR calc non Af Amer: 49 mL/min — ABNORMAL LOW (ref >60)
GFR calc non Af Amer: 53 mL/min — ABNORMAL LOW (ref >60)
GFR calc non Af Amer: 55 mL/min — ABNORMAL LOW (ref >60)
GFR, EST AFRICAN AMERICAN: 56 mL/min — AB (ref >60)
GFR, EST AFRICAN AMERICAN: 57 mL/min — AB (ref >60)
GFR, EST NON AFRICAN AMERICAN: 54 mL/min — AB (ref >60)
Glucose, Bld: 242 mg/dL — ABNORMAL HIGH (ref 65–99)
Glucose, Bld: 186 mg/dL — ABNORMAL HIGH (ref 65–99)
Glucose, Bld: 150 mg/dL — ABNORMAL HIGH (ref 65–99)
Glucose, Bld: 197 mg/dL — ABNORMAL HIGH (ref 65–99)
Glucose, Bld: 157 mg/dL — ABNORMAL HIGH (ref 65–99)
POTASSIUM: 3.4 mmol/L — AB (ref 3.5–5.1)
POTASSIUM: 3.4 mmol/L — AB (ref 3.5–5.1)
POTASSIUM: 3.7 mmol/L (ref 3.5–5.1)
Potassium: 3.3 mmol/L — ABNORMAL LOW (ref 3.5–5.1)
Potassium: 3.8 mmol/L (ref 3.5–5.1)
SODIUM: 136 mmol/L (ref 135–145)
SODIUM: 135 mmol/L (ref 135–145)
SODIUM: 137 mmol/L (ref 135–145)
SODIUM: 136 mmol/L (ref 135–145)
Sodium: 136 mmol/L (ref 135–145)

## 2017-12-26 LAB — HEPARIN LEVEL (UNFRACTIONATED)
HEPARIN UNFRACTIONATED: 0.39 [IU]/mL (ref 0.30–0.70)
Heparin Unfractionated: 0.27 IU/mL — ABNORMAL LOW (ref 0.30–0.70)

## 2017-12-26 LAB — CBC
HCT: 48.6 % (ref 39.0–52.0)
HEMOGLOBIN: 16.7 g/dL (ref 13.0–17.0)
MCH: 32.1 pg (ref 26.0–34.0)
MCHC: 34.4 g/dL (ref 30.0–36.0)
MCV: 93.3 fL (ref 78.0–100.0)
Platelets: 229 10*3/uL (ref 150–400)
RBC: 5.21 MIL/uL (ref 4.22–5.81)
RDW: 13.2 % (ref 11.5–15.5)
WBC: 19.7 10*3/uL — ABNORMAL HIGH (ref 4.0–10.5)

## 2017-12-26 LAB — POCT I-STAT 3, ART BLOOD GAS (G3+)
Acid-base deficit: 14 mmol/L — ABNORMAL HIGH (ref 0.0–2.0)
Acid-base deficit: 15 mmol/L — ABNORMAL HIGH (ref 0.0–2.0)
Acid-base deficit: 13 mmol/L — ABNORMAL HIGH (ref 0.0–2.0)
BICARBONATE: 14.6 mmol/L — AB (ref 20.0–28.0)
BICARBONATE: 15.9 mmol/L — AB (ref 20.0–28.0)
Bicarbonate: 13.7 mmol/L — ABNORMAL LOW (ref 20.0–28.0)
O2 SAT: 86 %
O2 SAT: 92 %
O2 Saturation: 93 %
PCO2 ART: 27.3 mmHg — AB (ref 32.0–48.0)
PH ART: 7.104 — AB (ref 7.350–7.450)
PO2 ART: 68 mmHg — AB (ref 83.0–108.0)
PO2 ART: 61 mmHg — AB (ref 83.0–108.0)
Patient temperature: 33.1
TCO2: 16 mmol/L — ABNORMAL LOW (ref 22–32)
TCO2: 18 mmol/L — ABNORMAL LOW (ref 22–32)
TCO2: 15 mmol/L — ABNORMAL LOW (ref 22–32)
pCO2 arterial: 44.4 mmHg (ref 32.0–48.0)
pCO2 arterial: 47.8 mmHg (ref 32.0–48.0)
pH, Arterial: 7.125 — CL (ref 7.350–7.450)
pH, Arterial: 7.287 — ABNORMAL LOW (ref 7.350–7.450)
pO2, Arterial: 71 mmHg — ABNORMAL LOW (ref 83.0–108.0)

## 2017-12-26 LAB — BLOOD GAS, ARTERIAL
ACID-BASE DEFICIT: 11.7 mmol/L — AB (ref 0.0–2.0)
Acid-base deficit: 11.6 mmol/L — ABNORMAL HIGH (ref 0.0–2.0)
BICARBONATE: 15 mmol/L — AB (ref 20.0–28.0)
Bicarbonate: 14.5 mmol/L — ABNORMAL LOW (ref 20.0–28.0)
FIO2: 70
FIO2: 60
LHR: 14 {breaths}/min
MECHVT: 600 mL
MECHVT: 600 mL
O2 Saturation: 93.1 %
O2 Saturation: 94.4 %
PATIENT TEMPERATURE: 98.6
PEEP/CPAP: 5 cmH2O
PEEP: 5 cmH2O
PH ART: 7.18 — AB (ref 7.350–7.450)
PO2 ART: 74.9 mmHg — AB (ref 83.0–108.0)
Patient temperature: 98.6
RATE: 22 resp/min
pCO2 arterial: 41.9 mmHg (ref 32.0–48.0)
pCO2 arterial: 35.9 mmHg (ref 32.0–48.0)
pH, Arterial: 7.23 — ABNORMAL LOW (ref 7.350–7.450)
pO2, Arterial: 72.6 mmHg — ABNORMAL LOW (ref 83.0–108.0)

## 2017-12-26 LAB — GLUCOSE, CAPILLARY
GLUCOSE-CAPILLARY: 137 mg/dL — AB (ref 65–99)
GLUCOSE-CAPILLARY: 148 mg/dL — AB (ref 65–99)
GLUCOSE-CAPILLARY: 150 mg/dL — AB (ref 65–99)
GLUCOSE-CAPILLARY: 150 mg/dL — AB (ref 65–99)
GLUCOSE-CAPILLARY: 151 mg/dL — AB (ref 65–99)
GLUCOSE-CAPILLARY: 154 mg/dL — AB (ref 65–99)
GLUCOSE-CAPILLARY: 156 mg/dL — AB (ref 65–99)
GLUCOSE-CAPILLARY: 161 mg/dL — AB (ref 65–99)
GLUCOSE-CAPILLARY: 206 mg/dL — AB (ref 65–99)
GLUCOSE-CAPILLARY: 230 mg/dL — AB (ref 65–99)
GLUCOSE-CAPILLARY: 99 mg/dL (ref 65–99)
Glucose-Capillary: 146 mg/dL — ABNORMAL HIGH (ref 65–99)
Glucose-Capillary: 146 mg/dL — ABNORMAL HIGH (ref 65–99)
Glucose-Capillary: 146 mg/dL — ABNORMAL HIGH (ref 65–99)
Glucose-Capillary: 159 mg/dL — ABNORMAL HIGH (ref 65–99)
Glucose-Capillary: 165 mg/dL — ABNORMAL HIGH (ref 65–99)
Glucose-Capillary: 182 mg/dL — ABNORMAL HIGH (ref 65–99)
Glucose-Capillary: 187 mg/dL — ABNORMAL HIGH (ref 65–99)
Glucose-Capillary: 191 mg/dL — ABNORMAL HIGH (ref 65–99)
Glucose-Capillary: 237 mg/dL — ABNORMAL HIGH (ref 65–99)
Glucose-Capillary: 99 mg/dL (ref 65–99)

## 2017-12-26 LAB — TROPONIN I
Troponin I: 0.31 ng/mL (ref <0.03)
Troponin I: 0.2 ng/mL (ref <0.03)

## 2017-12-26 LAB — APTT: aPTT: 59 seconds — ABNORMAL HIGH (ref 24–36)

## 2017-12-26 LAB — HIV ANTIBODY (ROUTINE TESTING W REFLEX): HIV SCREEN 4TH GENERATION: NONREACTIVE

## 2017-12-26 LAB — HEMOGLOBIN A1C
Hgb A1c MFr Bld: 5.5 % (ref 4.8–5.6)
Mean Plasma Glucose: 111.15 mg/dL

## 2017-12-26 LAB — PROTIME-INR
INR: 1.07
PROTHROMBIN TIME: 13.9 s (ref 11.4–15.2)

## 2017-12-26 MED ORDER — KETAMINE HCL 100 MG/ML IJ SOLN
1.5000 mg/kg/h | INTRAMUSCULAR | Status: DC
  Administered 2017-12-26: 1.5 mg/kg/h via INTRAVENOUS
  Filled 2017-12-26 (×2): qty 2

## 2017-12-26 MED ORDER — LACOSAMIDE 200 MG/20ML IV SOLN
200.0000 mg | Freq: Two times a day (BID) | INTRAVENOUS | Status: DC
  Administered 2017-12-26 – 2017-12-28 (×5): 200 mg via INTRAVENOUS
  Filled 2017-12-26 (×6): qty 20

## 2017-12-26 MED ORDER — KETAMINE PEDS BOLUS VIA INFUSION
1.5000 mg/kg | Freq: Once | INTRAVENOUS | Status: AC
  Administered 2017-12-26: 165.3 mg via INTRAVENOUS
  Filled 2017-12-26: qty 170

## 2017-12-26 MED ORDER — VALPROATE SODIUM 500 MG/5ML IV SOLN
2000.0000 mg | INTRAVENOUS | Status: AC
  Administered 2017-12-26: 2000 mg via INTRAVENOUS
  Filled 2017-12-26: qty 20

## 2017-12-26 MED ORDER — KETAMINE HCL 50 MG/ML IJ SOLN: 1.5000 mg/kg | Freq: Once | INTRAMUSCULAR | Status: DC

## 2017-12-26 MED ORDER — POTASSIUM CHLORIDE 20 MEQ/15ML (10%) PO SOLN
20.0000 meq | ORAL | Status: AC
  Administered 2017-12-26 (×2): 20 meq
  Filled 2017-12-26 (×2): qty 15

## 2017-12-26 MED ORDER — LEVETIRACETAM IN NACL 1500 MG/100ML IV SOLN
1500.0000 mg | Freq: Two times a day (BID) | INTRAVENOUS | Status: DC
  Administered 2017-12-26 – 2017-12-28 (×4): 1500 mg via INTRAVENOUS
  Filled 2017-12-26 (×5): qty 100

## 2017-12-26 MED ORDER — NOREPINEPHRINE BITARTRATE 1 MG/ML IV SOLN
0.0000 ug/min | INTRAVENOUS | Status: DC
  Administered 2017-12-26: 38 ug/min via INTRAVENOUS
  Administered 2017-12-26: 34 ug/min via INTRAVENOUS
  Administered 2017-12-27: 22 ug/min via INTRAVENOUS
  Administered 2017-12-27: 43 ug/min via INTRAVENOUS
  Administered 2017-12-27: 36 ug/min via INTRAVENOUS
  Filled 2017-12-26 (×6): qty 16

## 2017-12-26 MED ORDER — STERILE WATER FOR INJECTION IV SOLN
INTRAVENOUS | Status: DC
  Administered 2017-12-26 – 2017-12-28 (×4): via INTRAVENOUS
  Filled 2017-12-26 (×7): qty 850

## 2017-12-26 MED ORDER — SODIUM CHLORIDE 0.9 % IV SOLN
2.5000 mg/kg/h | INTRAVENOUS | Status: DC
  Administered 2017-12-26 – 2017-12-27 (×8): 2.5 mg/kg/h via INTRAVENOUS
  Filled 2017-12-26: qty 5
  Filled 2017-12-26: qty 10
  Filled 2017-12-26 (×6): qty 5

## 2017-12-26 MED ORDER — KETAMINE HCL-SODIUM CHLORIDE 100-0.9 MG/10ML-% IV SOSY: 1.5000 mg/kg | PREFILLED_SYRINGE | Freq: Once | INTRAVENOUS | Status: DC

## 2017-12-26 MED ORDER — SODIUM CHLORIDE 0.9 % IV SOLN
2000.0000 mg | INTRAVENOUS | Status: AC
  Administered 2017-12-26: 2000 mg via INTRAVENOUS
  Filled 2017-12-26: qty 20

## 2017-12-26 MED ORDER — MIDAZOLAM HCL 50 MG/10ML IJ SOLN
20.0000 mg/h | INTRAMUSCULAR | Status: DC
  Administered 2017-12-26: 20 mg/h via INTRAVENOUS
  Administered 2017-12-26: 10 mg/h via INTRAVENOUS
  Administered 2017-12-26 – 2017-12-27 (×2): 20 mg/h via INTRAVENOUS
  Administered 2017-12-28: 2 mg/h via INTRAVENOUS
  Filled 2017-12-26 (×5): qty 20

## 2017-12-26 MED ORDER — VALPROATE SODIUM 500 MG/5ML IV SOLN
500.0000 mg | Freq: Three times a day (TID) | INTRAVENOUS | Status: DC
  Administered 2017-12-26 – 2017-12-28 (×6): 500 mg via INTRAVENOUS
  Filled 2017-12-26 (×7): qty 5

## 2017-12-26 NOTE — Progress Notes (Signed)
Despite multiple AEDs and lagre boluses of versed, the EEG is worsening, not improving. My strong suspicion at this point is that he has had a sevree anoxic brain injury, but still reasonable to try to control seizures for now. With boluses of versed he has required increasing pressor doses and I do not think propofol or pentobarbital would be tolerated.   Another option would be ketamine and I will start this. If unable to gain control with this, or if they continues to be refractory on weaning of ketamine, I then I do not think that I would continue to be more aggressive.   This patient is critically ill and at significant risk of neurological worsening, death and care requires constant monitoring of vital signs, hemodynamics,respiratory and cardiac monitoring, neurological assessment, discussion with family, other specialists and medical decision making of high complexity. I spent and additional 30 minutes of neurocritical care time  in the care of  this patient.  Ritta SlotMcNeill Brissia Delisa, MD [facility] [phone]  If 7pm- 7am, please page neurology on call as listed in AMION. 12/26/2017  3:24 PM

## 2017-12-26 NOTE — Progress Notes (Signed)
DAILY PROGRESS NOTE   Patient Name: Devin Green Date of Encounter: 12/26/2017  Chief Complaint   Intubated, sedated  Patient Profile   Devin Green is a 67 y.o. male with a reported hx of DM who is being seen today for the evaluation of out of hospital cardiac arrest,  at the request of Dr. Kathrynn Humble, Emergency Medicine.  Subjective   No major events overnight. On hypothermic protocol. Troponin peaked at 0.59 and is declining. On IV heparin. Creatinine improved. EKG personally reviewed demonstrates NSR with RBBB - QTC 523 msec.  Objective   Vitals:   12/26/17 0615 12/26/17 0630 12/26/17 0645 12/26/17 0700  BP: (!) 148/78   124/69  Pulse:      Resp: _0 Temp:    (!) 92.1 F (33.4 C)  TempSrc:      SpO2: 97% 98% 100% 99%  Weight:      Height:        Intake/Output Summary (Last 24 hours) at 12/26/2017 0831 Last data filed at 12/26/2017 0700 Gross per 24 hour  Intake 2160.27 ml  Output 675 ml  Net 1485.27 ml   Filed Weights   01/05/2018 1647 01/14/2018 1830 12/26/17 0400  Weight: 250 lb (113.4 kg) 248 lb 0.3 oz (112.5 kg) 242 lb 15.5 oz (110.2 kg)    Physical Exam   General appearance: paralyzed, intubated and sedated Neck: no carotid bruit, no JVD and thyroid not enlarged, symmetric, no tenderness/mass/nodules Lungs: clear to auscultation bilaterally Heart: regular rate and rhythm Abdomen: soft, non-tender; bowel sounds normal; no masses,  no organomegaly Extremities: cool, no edema Pulses: 2+ and symmetric Skin: cool, dry Neurologic: Mental status: paralyzed, intubated, sedated Psych: Cannot assess  Inpatient Medications    Scheduled Meds: . amiodarone  150 mg Intravenous Once  . artificial tears  1 application Both Eyes H6D  . chlorhexidine gluconate (MEDLINE KIT)  15 mL Mouth Rinse BID  . Chlorhexidine Gluconate Cloth  6 each Topical Daily  . fentaNYL (SUBLIMAZE) injection  50 mcg Intravenous Once  . insulin aspart  0-9 Units  Subcutaneous TID WC  . mouth rinse  15 mL Mouth Rinse 10 times per day  . midazolam  1 mg Intravenous Once  . pantoprazole (PROTONIX) IV  40 mg Intravenous QHS  . potassium chloride  20 mEq Per Tube Q4H  . sodium chloride flush  10-40 mL Intracatheter Q12H    Continuous Infusions: . sodium chloride    . sodium chloride    . sodium chloride 75 mL/hr at 01/19/2018 2000  . amiodarone 30 mg/hr (12/26/17 0515)  . cisatracurium (NIMBEX) infusion 1.205 mcg/kg/min (01/16/2018 2000)  . fentaNYL infusion INTRAVENOUS 225 mcg/hr (12/26/17 0538)  . heparin 1,000 Units/hr (12/26/17 0255)  . insulin (NOVOLIN-R) infusion 6.1 Units/hr (12/26/17 0646)  . midazolam (VERSED) infusion 2 mg/hr (01/12/2018 2000)  . norepinephrine (LEVOPHED) Adult infusion 6 mcg/min (12/26/17 0704)    PRN Meds: Place/Maintain arterial line **AND** sodium chloride, [COMPLETED] cisatracurium **AND** cisatracurium (NIMBEX) infusion **AND** cisatracurium, fentaNYL, fentaNYL (SUBLIMAZE) injection, fentaNYL (SUBLIMAZE) injection, midazolam, midazolam, midazolam, sodium chloride flush   Labs   Results for orders placed or performed during the hospital encounter of 12/23/2017 (from the past 48 hour(s))  CBC with Differential/Platelet     Status: Abnormal   Collection Time: 01/08/2018  4:40 PM  Result Value Ref Range   WBC 12.2 (H) 4.0 - 10.5 K/uL   RBC 4.80 4.22 - 5.81 MIL/uL   Hemoglobin 15.1  13.0 - 17.0 g/dL   HCT 45.1 39.0 - 52.0 %   MCV 94.0 78.0 - 100.0 fL   MCH 31.5 26.0 - 34.0 pg   MCHC 33.5 30.0 - 36.0 g/dL   RDW 13.5 11.5 - 15.5 %   Platelets 238 150 - 400 K/uL   Neutrophils Relative % 54 %   Neutro Abs 6.6 1.7 - 7.7 K/uL   Lymphocytes Relative 40 %   Lymphs Abs 4.8 (H) 0.7 - 4.0 K/uL   Monocytes Relative 5 %   Monocytes Absolute 0.6 0.1 - 1.0 K/uL   Eosinophils Relative 1 %   Eosinophils Absolute 0.1 0.0 - 0.7 K/uL   Basophils Relative 0 %   Basophils Absolute 0.0 0.0 - 0.1 K/uL    Comment: Performed at [facility] [address] [place], [place] [address]  Protime-INR     Status: None   Collection Time: 01/07/2018  4:40 PM  Result Value Ref Range   Prothrombin Time 13.4 11.4 - 15.2 seconds   INR 1.03     Comment: Performed at [facility] [address], [place], [place] [address]  APTT     Status: None   Collection Time: 01/14/2018  4:40 PM  Result Value Ref Range   aPTT 26 24 - 36 seconds    Comment: Performed at [facility] [address], [place], [place] [address]  Comprehensive metabolic panel     Status: Abnormal   Collection Time: 01/20/2018  4:40 PM  Result Value Ref Range   Sodium 140 135 - 145 mmol/L   Potassium 3.6 3.5 - 5.1 mmol/L    Comment: SPECIMEN HEMOLYZED. HEMOLYSIS MAY AFFECT INTEGRITY OF RESULTS.   Chloride 111 101 - 111 mmol/L   CO2 14 (L) 22 - 32 mmol/L   Glucose, Bld 193 (H) 65 - 99 mg/dL   BUN 18 6 - 20 mg/dL   Creatinine, Ser 1.74 (H) 0.61 - 1.24 mg/dL   Calcium 7.1 (L) 8.9 - 10.3 mg/dL   Total Protein 4.6 (L) 6.5 - 8.1 g/dL   Albumin 2.9 (L) 3.5 - 5.0 g/dL   AST 45 (H) 15 - 41 U/L   ALT 29 17 - 63 U/L   Alkaline Phosphatase 71 38 - 126 U/L   Total Bilirubin 0.9 0.3 - 1.2 mg/dL   GFR calc non Af Amer 39 (L) >60 mL/min   GFR calc Af Amer 46 (L) >60 mL/min    Comment: (NOTE) The eGFR has been calculated using the CKD EPI equation. This calculation has not been validated in all clinical situations. eGFR's persistently <60 mL/min signify possible Chronic Kidney Disease.    Anion gap 15 5 - 15    Comment: Performed at [facility] [address] [place], [place] [address]  Lipid panel     Status: Abnormal   Collection Time: 01/15/2018  4:40 PM  Result Value Ref Range   Cholesterol 146 0 - 200 mg/dL   Triglycerides 182 (H) <150 mg/dL   HDL 35 (L) >40 mg/dL   Total CHOL/HDL Ratio 4.2 RATIO   VLDL 36 0 - 40 mg/dL   LDL Cholesterol 75 0 - 99 mg/dL    Comment:        Total Cholesterol/HDL:CHD Risk Coronary Heart Disease Risk Table                      Men   Women  1/2 Average  Risk   3.4   3.3  Average Risk       5.0   4.4  2 X Average Risk   9.6   7.1  3 X Average Risk  23.4   11.0        Use the calculated Patient Ratio above and the CHD Risk Table to determine the patient's CHD Risk.        ATP III CLASSIFICATION (LDL):  <100     mg/dL   Optimal  100-129  mg/dL   Near or Above                    Optimal  130-159  mg/dL   Borderline  160-189  mg/dL   High  >190     mg/dL   Very High Performed at [facility] [address], [place], [place] [address]   Magnesium     Status: None   Collection Time: 01/11/2018  4:40 PM  Result Value Ref Range   Magnesium 2.1 1.7 - 2.4 mg/dL    Comment: Performed at [facility] [address] [place], [place] [address]  Troponin I (0, 3, 6)     Status: Abnormal   Collection Time: 01/15/2018  4:40 PM  Result Value Ref Range   Troponin I 0.06 (HH) <0.03 ng/mL    Comment: CRITICAL RESULT CALLED TO, READ BACK BY AND VERIFIED WITH: Rhae Hammock RN [phone] [place] Performed at [facility], [place] [address], [place], [place] [address]   Brain natriuretic peptide     Status: None   Collection Time: 01/12/2018  4:41 PM  Result Value Ref Range   B Natriuretic Peptide 40.9 0.0 - 100.0 pg/mL    Comment: Performed at [facility] [address] [place], [place] [address]  I-Stat Troponin, ED     Status: None   Collection Time: 01/01/2018  4:48 PM  Result Value Ref Range   Troponin i, poc 0.07 0.00 - 0.08 ng/mL   Comment 3            Comment: Due to the release kinetics of cTnI, a negative result within the first hours of the onset of symptoms does not rule out myocardial infarction with certainty. If myocardial infarction is still suspected, repeat the test at appropriate intervals.   I-Stat Chem 8, ED     Status: Abnormal   Collection Time: 12/27/2017  4:54 PM  Result Value Ref Range   Sodium 138 135 - 145 mmol/L   Potassium 3.7 3.5 - 5.1 mmol/L    Chloride 104 101 - 111 mmol/L   BUN 20 6 - 20 mg/dL   Creatinine, Ser 1.80 (H) 0.61 - 1.24 mg/dL   Glucose, Bld 226 (H) 65 - 99 mg/dL   Calcium, Ion 1.07 (L) 1.15 - 1.40 mmol/L   TCO2 17 (L) 22 - 32 mmol/L   Hemoglobin 16.0 13.0 - 17.0 g/dL   HCT 47.0 39.0 - 52.0 %  Triglycerides     Status: None   Collection Time: 12/27/2017  4:54 PM  Result Value Ref Range   Triglycerides 146 <150 mg/dL    Comment: Performed at [facility] [address] [place], [place] [address]  I-Stat CG4 Lactic Acid, ED     Status: Abnormal   Collection Time: 12/31/2017  4:56 PM  Result Value Ref Range   Lactic Acid, Venous 8.40 (HH) 0.5 - 1.9 mmol/L  Comment NOTIFIED PHYSICIAN   I-STAT 3, arterial blood gas (G3+)     Status: Abnormal   Collection Time: 12/31/2017  6:32 PM  Result Value Ref Range   pH, Arterial 7.241 (L) 7.350 - 7.450   pCO2 arterial 43.8 32.0 - 48.0 mmHg   pO2, Arterial 101.0 83.0 - 108.0 mmHg   Bicarbonate 19.0 (L) 20.0 - 28.0 mmol/L   TCO2 20 (L) 22 - 32 mmol/L   O2 Saturation 97.0 %   Acid-base deficit 9.0 (H) 0.0 - 2.0 mmol/L   Patient temperature 36.1 C    Collection site ARTERIAL LINE    Drawn by RT    Sample type ARTERIAL   I-STAT, chem 8     Status: Abnormal   Collection Time: 01/12/2018  6:38 PM  Result Value Ref Range   Sodium 140 135 - 145 mmol/L   Potassium 3.9 3.5 - 5.1 mmol/L   Chloride 104 101 - 111 mmol/L   BUN 22 (H) 6 - 20 mg/dL   Creatinine, Ser 1.60 (H) 0.61 - 1.24 mg/dL   Glucose, Bld 235 (H) 65 - 99 mg/dL   Calcium, Ion 1.19 1.15 - 1.40 mmol/L   TCO2 20 (L) 22 - 32 mmol/L   Hemoglobin 15.3 13.0 - 17.0 g/dL   HCT 45.0 39.0 - 52.0 %  Urinalysis, Routine w reflex microscopic     Status: Abnormal   Collection Time: 12/31/2017  6:59 PM  Result Value Ref Range   Color, Urine YELLOW YELLOW   APPearance TURBID (A) CLEAR   Specific Gravity, Urine >1.030 (H) 1.005 - 1.030   pH 5.0 5.0 - 8.0   Glucose, UA NEGATIVE NEGATIVE mg/dL   Hgb urine dipstick MODERATE (A)  NEGATIVE   Bilirubin Urine NEGATIVE NEGATIVE   Ketones, ur NEGATIVE NEGATIVE mg/dL   Protein, ur NEGATIVE NEGATIVE mg/dL   Nitrite NEGATIVE NEGATIVE   Leukocytes, UA NEGATIVE NEGATIVE    Comment: Performed at [facility], [place] [address] [place], [place] [address]  MRSA PCR Screening     Status: None   Collection Time: 12/29/2017  6:59 PM  Result Value Ref Range   MRSA by PCR NEGATIVE NEGATIVE    Comment:        The GeneXpert MRSA Assay (FDA approved for NASAL specimens only), is one component of a comprehensive MRSA colonization surveillance program. It is not intended to diagnose MRSA infection nor to guide or monitor treatment for MRSA infections. Performed at [facility], [place] [address] [place], [place] [address]   Urinalysis, Microscopic (reflex)     Status: Abnormal   Collection Time: 12/30/2017  6:59 PM  Result Value Ref Range   RBC / HPF 0-5 0 - 5 RBC/hpf   WBC, UA NONE SEEN 0 - 5 WBC/hpf   Bacteria, UA RARE (A) NONE SEEN   Squamous Epithelial / LPF 0-5 (A) NONE SEEN   Urine-Other AMORPHOUS URATES/PHOSPHATES     Comment: LESS THAN 10 mL OF URINE SUBMITTED MICROSCOPIC EXAM PERFORMED ON UNCONCENTRATED URINE URIC ACID CRYSTALS Performed at [facility], [place] [address], [place], [place] [address]   Troponin I     Status: Abnormal   Collection Time: 01/19/2018  7:00 PM  Result Value Ref Range   Troponin I 0.54 (HH) <0.03 ng/mL    Comment: CRITICAL RESULT CALLED TO, READ BACK BY AND VERIFIED WITH: C.SLUCHER,RN 01/04/2018 2040 DAVISB Performed at [facility] [address], [place], [place] [address]  Basic metabolic panel     Status: Abnormal   Collection Time: 01/05/2018  7:00 PM  Result Value Ref Range   Sodium 136 135 - 145 mmol/L   Potassium 3.5 3.5 - 5.1 mmol/L   Chloride 105 101 - 111 mmol/L   CO2 17 (L) 22 - 32 mmol/L   Glucose, Bld 227 (H) 65 - 99 mg/dL   BUN 21 (H) 6 - 20 mg/dL   Creatinine, Ser 1.73 (H) 0.61 - 1.24 mg/dL   Calcium  8.2 (L) 8.9 - 10.3 mg/dL   GFR calc non Af Amer 40 (L) >60 mL/min   GFR calc Af Amer 46 (L) >60 mL/min    Comment: (NOTE) The eGFR has been calculated using the CKD EPI equation. This calculation has not been validated in all clinical situations. eGFR's persistently <60 mL/min signify possible Chronic Kidney Disease.    Anion gap 14 5 - 15    Comment: Performed at [facility] [address], [place], [place] [address]  Protime-INR now and repeat in 8 hours     Status: None   Collection Time: 12/26/2017  7:00 PM  Result Value Ref Range   Prothrombin Time 13.4 11.4 - 15.2 seconds   INR 1.03     Comment: Performed at [facility] [address], [place], [place] [address]  APTT now and repeat in 8 hours     Status: None   Collection Time: 01/09/2018  7:00 PM  Result Value Ref Range   aPTT 27 24 - 36 seconds    Comment: Performed at [facility] [address] [place], [place] [address]  Glucose, capillary     Status: Abnormal   Collection Time: 01/05/2018  8:12 PM  Result Value Ref Range   Glucose-Capillary 145 (H) 65 - 99 mg/dL   Comment 1 Arterial Specimen   Troponin I (0, 3, 6)     Status: Abnormal   Collection Time: 12/26/2017  8:13 PM  Result Value Ref Range   Troponin I 0.59 (HH) <0.03 ng/mL    Comment: CRITICAL VALUE NOTED.  VALUE IS CONSISTENT WITH PREVIOUSLY REPORTED AND CALLED VALUE. Performed at [facility], [place] [address], [place], [place] [address]   Lactic acid, plasma     Status: Abnormal   Collection Time: 01/14/2018  9:01 PM  Result Value Ref Range   Lactic Acid, Venous 2.6 (HH) 0.5 - 1.9 mmol/L    Comment: CRITICAL RESULT CALLED TO, READ BACK BY AND VERIFIED WITH: SCHLESER,T RN 12/27/2017 2304 JORDANS Performed at [facility], [place] [address], [place], [place] [address]   Glucose, capillary     Status: Abnormal   Collection Time: 01/12/2018  9:04 PM  Result Value Ref Range   Glucose-Capillary 230 (H) 65 - 99 mg/dL   Comment 1  Arterial Specimen   Glucose, capillary     Status: Abnormal   Collection Time: 12/27/2017  9:53 PM  Result Value Ref Range   Glucose-Capillary 240 (H) 65 - 99 mg/dL   Comment 1 Arterial Specimen   Troponin I     Status: Abnormal   Collection Time: 01/02/2018  9:54 PM  Result Value Ref Range   Troponin I 0.48 (HH) <0.03 ng/mL    Comment: CRITICAL VALUE NOTED.  VALUE IS CONSISTENT WITH PREVIOUSLY REPORTED AND CALLED VALUE. Performed at [facility], [place] [address], [place], [place] [address]   Basic metabolic panel  Status: Abnormal   Collection Time: 01/17/2018  9:54 PM  Result Value Ref Range   Sodium 136 135 - 145 mmol/L   Potassium 3.2 (L) 3.5 - 5.1 mmol/L   Chloride 106 101 - 111 mmol/L   CO2 17 (L) 22 - 32 mmol/L   Glucose, Bld 242 (H) 65 - 99 mg/dL   BUN 21 (H) 6 - 20 mg/dL   Creatinine, Ser 1.41 (H) 0.61 - 1.24 mg/dL   Calcium 8.1 (L) 8.9 - 10.3 mg/dL   GFR calc non Af Amer 51 (L) >60 mL/min   GFR calc Af Amer 59 (L) >60 mL/min    Comment: (NOTE) The eGFR has been calculated using the CKD EPI equation. This calculation has not been validated in all clinical situations. eGFR's persistently <60 mL/min signify possible Chronic Kidney Disease.    Anion gap 13 5 - 15    Comment: Performed at [facility] [address], [place], [place] [address]  Glucose, capillary     Status: Abnormal   Collection Time: 01/13/2018 10:57 PM  Result Value Ref Range   Glucose-Capillary 212 (H) 65 - 99 mg/dL   Comment 1 Arterial Specimen   Basic metabolic panel     Status: Abnormal   Collection Time: 12/26/17 12:00 AM  Result Value Ref Range   Sodium 136 135 - 145 mmol/L   Potassium 3.7 3.5 - 5.1 mmol/L   Chloride 107 101 - 111 mmol/L   CO2 17 (L) 22 - 32 mmol/L   Glucose, Bld 242 (H) 65 - 99 mg/dL   BUN 20 6 - 20 mg/dL   Creatinine, Ser 1.46 (H) 0.61 - 1.24 mg/dL   Calcium 7.9 (L) 8.9 - 10.3 mg/dL   GFR calc non Af Amer 49 (L) >60 mL/min   GFR calc Af Amer 56 (L) >60 mL/min      Comment: (NOTE) The eGFR has been calculated using the CKD EPI equation. This calculation has not been validated in all clinical situations. eGFR's persistently <60 mL/min signify possible Chronic Kidney Disease.    Anion gap 12 5 - 15    Comment: Performed at [facility] [address], [place], [place] [address]  Glucose, capillary     Status: Abnormal   Collection Time: 12/26/17 12:02 AM  Result Value Ref Range   Glucose-Capillary 237 (H) 65 - 99 mg/dL  Glucose, capillary     Status: Abnormal   Collection Time: 12/26/17 12:53 AM  Result Value Ref Range   Glucose-Capillary 230 (H) 65 - 99 mg/dL  Protime-INR now and repeat in 8 hours     Status: None   Collection Time: 12/26/17  1:47 AM  Result Value Ref Range   Prothrombin Time 13.9 11.4 - 15.2 seconds   INR 1.07     Comment: Performed at [facility], [place] [address], [place], [place] [address]  APTT now and repeat in 8 hours     Status: Abnormal   Collection Time: 12/26/17  1:47 AM  Result Value Ref Range   aPTT 59 (H) 24 - 36 seconds    Comment:        IF BASELINE aPTT IS ELEVATED, SUGGEST PATIENT RISK ASSESSMENT BE USED TO DETERMINE APPROPRIATE ANTICOAGULANT THERAPY. Performed at [facility], [place] [address], [place], [place] [address]   Heparin level (unfractionated)     Status: Abnormal   Collection Time: 12/26/17  1:47 AM  Result Value Ref Range  Heparin Unfractionated 0.27 (L) 0.30 - 0.70 IU/mL    Comment:        IF HEPARIN RESULTS ARE BELOW EXPECTED VALUES, AND PATIENT DOSAGE HAS BEEN CONFIRMED, SUGGEST FOLLOW UP TESTING OF ANTITHROMBIN III LEVELS. Performed at [facility], [place] [address], [place], [place] [address]   Hemoglobin A1c     Status: None   Collection Time: 12/26/17  1:47 AM  Result Value Ref Range   Hgb A1c MFr Bld 5.5 4.8 - 5.6 %    Comment: (NOTE) Pre diabetes:          5.7%-6.4% Diabetes:              >6.4% Glycemic control for   <7.0% adults with diabetes     Mean Plasma Glucose 111.15 mg/dL    Comment: Performed at [facility] [address], [place], [place] [address]  Glucose, capillary     Status: Abnormal   Collection Time: 12/26/17  1:47 AM  Result Value Ref Range   Glucose-Capillary 206 (H) 65 - 99 mg/dL   Comment 1 Arterial Specimen   Glucose, capillary     Status: Abnormal   Collection Time: 12/26/17  2:42 AM  Result Value Ref Range   Glucose-Capillary 191 (H) 65 - 99 mg/dL   Comment 1 Arterial Specimen   Glucose, capillary     Status: Abnormal   Collection Time: 12/26/17  3:41 AM  Result Value Ref Range   Glucose-Capillary 182 (H) 65 - 99 mg/dL   Comment 1 Arterial Specimen   Troponin I     Status: Abnormal   Collection Time: 12/26/17  3:43 AM  Result Value Ref Range   Troponin I 0.31 (HH) <0.03 ng/mL    Comment: CRITICAL VALUE NOTED.  VALUE IS CONSISTENT WITH PREVIOUSLY REPORTED AND CALLED VALUE. Performed at [facility], [place] [address] [place], [place] [address]   CBC     Status: Abnormal   Collection Time: 12/26/17  3:43 AM  Result Value Ref Range   WBC 19.7 (H) 4.0 - 10.5 K/uL   RBC 5.21 4.22 - 5.81 MIL/uL   Hemoglobin 16.7 13.0 - 17.0 g/dL   HCT 48.6 39.0 - 52.0 %   MCV 93.3 78.0 - 100.0 fL   MCH 32.1 26.0 - 34.0 pg   MCHC 34.4 30.0 - 36.0 g/dL   RDW 13.2 11.5 - 15.5 %   Platelets 229 150 - 400 K/uL    Comment: Performed at [facility], [place] [address] [place],  [address]  Basic metabolic panel     Status: Abnormal   Collection Time: 12/26/17  3:43 AM  Result Value Ref Range   Sodium 136 135 - 145 mmol/L   Potassium 3.4 (L) 3.5 - 5.1 mmol/L   Chloride 107 101 - 111 mmol/L   CO2 14 (L) 22 - 32 mmol/L   Glucose, Bld 186 (H) 65 - 99 mg/dL   BUN 21 (H) 6 - 20 mg/dL   Creatinine, Ser 1.45 (H) 0.61 - 1.24 mg/dL   Calcium 7.9 (L) 8.9 - 10.3 mg/dL   GFR calc non Af Amer 49 (L) >60 mL/min   GFR calc Af Amer 57 (L) >60 mL/min    Comment: (NOTE) The eGFR has been calculated using the  CKD EPI equation. This calculation has not been validated in all clinical situations. eGFR's persistently <60 mL/min signify possible Chronic Kidney Disease.    Anion gap 15 5 - 15  Comment: Performed at [facility], [place] [address] [place], [place] [address]  Glucose, capillary     Status: Abnormal   Collection Time: 12/26/17  4:53 AM  Result Value Ref Range   Glucose-Capillary 165 (H) 65 - 99 mg/dL   Comment 1 Arterial Specimen   Glucose, capillary     Status: Abnormal   Collection Time: 12/26/17  5:40 AM  Result Value Ref Range   Glucose-Capillary 159 (H) 65 - 99 mg/dL   Comment 1 Arterial Specimen   Glucose, capillary     Status: Abnormal   Collection Time: 12/26/17  6:45 AM  Result Value Ref Range   Glucose-Capillary 161 (H) 65 - 99 mg/dL   Comment 1 Arterial Specimen     ECG   Sinus rhythm, iRBBB at 91, QTc 523 msec - Personally Reviewed  Telemetry   Sinus rhythm - Personally Reviewed  Radiology    Ct Head Wo Contrast  Result Date: 01/12/2018 CLINICAL DATA:  Poly trauma, head injury. Status post cardiac arrest EXAM: CT HEAD WITHOUT CONTRAST CT CERVICAL SPINE WITHOUT CONTRAST TECHNIQUE: Multidetector CT imaging of the head and cervical spine was performed following the standard protocol without intravenous contrast. Multiplanar CT image reconstructions of the cervical spine were also generated. COMPARISON:  None. FINDINGS: CT HEAD FINDINGS Brain: No evidence of acute infarction, hemorrhage, hydrocephalus, extra-axial collection or mass lesion/mass effect. Vascular: No hyperdense vessel or unexpected calcification. Skull: No osseous abnormality. Sinuses/Orbits: Bilateral maxillary sinus mucous retention cysts. Bilateral ethmoid sinus mucosal thickening. Visualized mastoid sinuses are clear. Visualized orbits demonstrate no focal abnormality. Other: None CT CERVICAL SPINE FINDINGS Alignment: 3 mm anterolisthesis of T1 on T2. Skull base and vertebrae: No acute fracture.  No primary bone lesion or focal pathologic process. Soft tissues and spinal canal: No prevertebral fluid or swelling. No visible canal hematoma. Disc levels: Degenerative disc disease with severe disc height loss at C5-6 and C6-7. Mild degenerative disc disease with disc height loss at C4-5. Osseous fusion of the posterior elements of C2-3. Mild broad-based disc bulge at C3-4 with moderate bilateral facet arthropathy. Bilateral uncovertebral degenerative changes at C4-5 and C5-6 with bilateral foraminal stenosis. Upper chest: Small right pleural effusion. Endotracheal tube partially visualized. Nasogastric tube partially visualized. Other: No fluid collection or hematoma. IMPRESSION: 1. No acute intracranial pathology. 2.  No acute osseous injury of the cervical spine. 3. Cervical spine spondylosis as described above. Electronically Signed   By: Kathreen Devoid   On: 01/03/2018 18:31   Ct Cervical Spine Wo Contrast  Result Date: 01/13/2018 CLINICAL DATA:  Poly trauma, head injury. Status post cardiac arrest EXAM: CT HEAD WITHOUT CONTRAST CT CERVICAL SPINE WITHOUT CONTRAST TECHNIQUE: Multidetector CT imaging of the head and cervical spine was performed following the standard protocol without intravenous contrast. Multiplanar CT image reconstructions of the cervical spine were also generated. COMPARISON:  None. FINDINGS: CT HEAD FINDINGS Brain: No evidence of acute infarction, hemorrhage, hydrocephalus, extra-axial collection or mass lesion/mass effect. Vascular: No hyperdense vessel or unexpected calcification. Skull: No osseous abnormality. Sinuses/Orbits: Bilateral maxillary sinus mucous retention cysts. Bilateral ethmoid sinus mucosal thickening. Visualized mastoid sinuses are clear. Visualized orbits demonstrate no focal abnormality. Other: None CT CERVICAL SPINE FINDINGS Alignment: 3 mm anterolisthesis of T1 on T2. Skull base and vertebrae: No acute fracture. No primary bone lesion or focal pathologic process.  Soft tissues and spinal canal: No prevertebral fluid or swelling. No visible canal hematoma. Disc levels: Degenerative disc disease with severe disc height loss at  C5-6 and C6-7. Mild degenerative disc disease with disc height loss at C4-5. Osseous fusion of the posterior elements of C2-3. Mild broad-based disc bulge at C3-4 with moderate bilateral facet arthropathy. Bilateral uncovertebral degenerative changes at C4-5 and C5-6 with bilateral foraminal stenosis. Upper chest: Small right pleural effusion. Endotracheal tube partially visualized. Nasogastric tube partially visualized. Other: No fluid collection or hematoma. IMPRESSION: 1. No acute intracranial pathology. 2.  No acute osseous injury of the cervical spine. 3. Cervical spine spondylosis as described above. Electronically Signed   By: Kathreen Devoid   On: 12/23/2017 18:31   Dg Chest Port 1 View  Result Date: 01/12/2018 CLINICAL DATA:  67 y/o  M; post cardiac arrest.  ET tube placement. EXAM: PORTABLE CHEST 1 VIEW COMPARISON:  None. FINDINGS: Stable cardiac silhouette given projection and technique. Low lung volumes. No consolidation, effusion, or pneumothorax identified. Endotracheal tube tip projects 3.7 cm above the carina. Left lung partially obscured by transcutaneous pacing pads. No acute osseous abnormality identified. IMPRESSION: Endotracheal tube 3.7 cm above the carina. Low lung volumes. No acute pulmonary process identified. Electronically Signed   By: Kristine Garbe M.D.   On: 12/22/2017 17:02   Dg Abd Portable 1v  Result Date: 01/06/2018 CLINICAL DATA:  67 y/o  M; OG tube placement. EXAM: PORTABLE ABDOMEN - 1 VIEW COMPARISON:  None. FINDINGS: The bowel gas pattern is normal. No radio-opaque calculi or other significant radiographic abnormality are seen. Enteric tube tip projects over distal stomach. IMPRESSION: Enteric tube tip projects over distal stomach. Electronically Signed   By: Kristine Garbe M.D.   On: 01/13/2018  19:44    Cardiac Studies   N/A  Assessment   1. Active Problems: 2.   Ventricular fibrillation ([place]) 3.   Diabetes ([place]) 4.   Cardiac arrest ([place]) 5.   Plan   1. No ectopy overnight - etiology of VF likely ischemia. Would d/c amiodarone. Mild troponin elevation - will need cardiac cath once neurologic recovery is noted. Continue IV heparin, aspirin.  Time Spent Directly with Patient:  I have spent a total of 35 minutes with the patient reviewing hospital notes, telemetry, EKGs, labs and examining the patient as well as establishing an assessment and plan that was discussed personally with the patient. > 50% of time was spent in direct patient care.  Length of Stay:  LOS: 1 day   Pixie Casino, MD, [facility], [place] Director of the Advanced Lipid Disorders &  Cardiovascular Risk Reduction Clinic Diplomate of the American Board of Clinical Lipidology Attending Cardiologist  Direct Dial: [phone]  Fax: [phone]  Website:  [email or web address] Devin Green 12/26/2017, 8:31 AM

## 2017-12-26 NOTE — Progress Notes (Signed)
Initial Nutrition Assessment  DOCUMENTATION CODES:   Obesity unspecified  INTERVENTION:   Recommend Vital High Protein @ 35 ml/hr (840 ml/day) 60 ml Prostat TID MVI daily  Provides: 1440 kcal, 163 grams protein, and 702 ml free water.    NUTRITION DIAGNOSIS:   Inadequate oral intake related to inability to eat as evidenced by NPO status.  GOAL:   Provide needs based on ASPEN/SCCM guidelines  MONITOR:   I & O's, Vent status  REASON FOR ASSESSMENT:   Ventilator    ASSESSMENT:   Pt admitted after witnessed cardiac arrest while working in his yard.    [place] following for prognosis but pt remains on paralytic currently.  Patient is currently intubated on ventilator support Temp (24hrs), Avg:92.5 F (33.6 C), Min:89.8 F (32.1 C), Max:96.9 F (36.1 C)   Medications reviewed and include: levophed @ 38 mcg/min and nimbex for paralytic  Labs reviewed    NUTRITION - FOCUSED PHYSICAL EXAM:    Most Recent Value  Orbital Region  No depletion  Upper Arm Region  No depletion  Thoracic and Lumbar Region  Unable to assess  Buccal Region  Unable to assess  Temple Region  No depletion  Clavicle Bone Region  No depletion  Clavicle and Acromion Bone Region  No depletion  Scapular Bone Region  Unable to assess  Dorsal Hand  No depletion  Patellar Region  Unable to assess  Anterior Thigh Region  Unable to assess  Posterior Calf Region  No depletion  Edema (RD Assessment)  Mild  Hair  Reviewed  Eyes  Unable to assess  Mouth  Unable to assess  Skin  Reviewed  Nails  Reviewed       Diet Order:  Diet NPO time specified  EDUCATION NEEDS:   No education needs have been identified at this time  Skin:  Skin Assessment: Reviewed RN Assessment  Last BM:  unknown  Height:   Ht Readings from Last 1 Encounters:  01/19/2018 6' (1.829 m)    Weight:   Wt Readings from Last 1 Encounters:  12/26/17 242 lb 15.5 oz (110.2 kg)    Ideal Body Weight:  80.9 kg  BMI:   Body mass index is 32.95 kg/m.  Estimated Nutritional Needs:   Kcal:  [id number]  Protein:  > 161 grams  Fluid:  2 L/day  Kendell BaneHeather Rayman Petrosian RD, LDN, CNSC [phone] Pager [phone] After Hours Pager

## 2017-12-26 NOTE — Progress Notes (Addendum)
PULMONARY / CRITICAL CARE MEDICINE   Name: Devin Green MRN: [id number] DOB: 02-07-1951    ADMISSION DATE:  01-10-18 CONSULTATION DATE:  01-10-18  REFERRING MD: ED  CHIEF COMPLAINT:  Cardiac arrest  HISTORY OF PRESENT ILLNESS:    67 yr old male with no available past history coming in after witnessed cardiac arrest. He was working in the yard with his family were he collapsed and CPR started immediately and when EMS arrived he was shocked twice. He was down for around before ROSC. Upon arrival to the ED he was in sinus rhythm but GCS 3. Agitated, biting on the tube , started on propofol & then cooled EKG >> ST elevation avR, lateral T wave inversions   SUBJ - Intubated, paralysed, sedated, being cooled  VITAL SIGNS: BP 124/69   Pulse 82   Temp (!) 92.1 F (33.4 C)   Resp 14   Ht 6' (1.829 m)   Wt 242 lb 15.5 oz (110.2 kg)   SpO2 99%   BMI 32.95 kg/m   HEMODYNAMICS: CVP:  [12 mmHg-16 mmHg] 15 mmHg  VENTILATOR SETTINGS: Vent Mode: PRVC FiO2 (%):  [80 %-100 %] 80 % Set Rate:  [14 bmp-16 bmp] 14 bmp Vt Set:  [600 mL] 600 mL PEEP:  [5 cmH20] 5 cmH20 Plateau Pressure:  [18 cmH20-23 cmH20] 23 cmH20  INTAKE / OUTPUT: I/O last 3 completed shifts: In: 2160.3 [I.V.:1940.3; NG/GT:120; IV Piggyback:100] Out: 675 [Urine:675]  PHYSICAL EXAMINATION: Gen. Sedated , paralysed, on vent, being cooled ENT -c collar Neck: No JVD, no thyromegaly, no carotid bruits Lungs: no use of accessory muscles, no dullness to percussion, clear without rales or rhonchi  Cardiovascular: Rhythm regular, heart sounds  normal, no murmurs or gallops, no peripheral edema Abdomen: soft and non-tender, no hepatosplenomegaly, BS normal. Musculoskeletal: No deformities, no cyanosis or clubbing Neuro:  GCS 3, sedated, paralysed   LABS:  BMET Recent Labs  Lab 01/10/2018 2154 12/26/17 0000 12/26/17 0343  NA 136 136 136  K 3.2* 3.7 3.4*  CL 106 107 107  CO2 17* 17* 14*  BUN 21* 20  21*  CREATININE 1.41* 1.46* 1.45*  GLUCOSE 242* 242* 186*    Electrolytes Recent Labs  Lab 10-Jan-2018 1640  01/10/18 2154 12/26/17 0000 12/26/17 0343  CALCIUM 7.1*   < > 8.1* 7.9* 7.9*  MG 2.1  --   --   --   --    < > = values in this interval not displayed.    CBC Recent Labs  Lab Jan 10, 2018 1640 10-Jan-2018 1654 January 10, 2018 1838 12/26/17 0343  WBC 12.2*  --   --  19.7*  HGB 15.1 16.0 15.3 16.7  HCT 45.1 47.0 45.0 48.6  PLT 238  --   --  229    Coag's Recent Labs  Lab Jan 10, 2018 1640 01/10/18 1900 12/26/17 0147  APTT 26 27 59*  INR 1.03 1.03 1.07    Sepsis Markers Recent Labs  Lab 01/10/18 1656 01-10-18 2101  LATICACIDVEN 8.40* 2.6*    ABG Recent Labs  Lab 01-10-18 1832  PHART 7.241*  PCO2ART 43.8  PO2ART 101.0    Liver Enzymes Recent Labs  Lab 01-10-2018 1640  AST 45*  ALT 29  ALKPHOS 71  BILITOT 0.9  ALBUMIN 2.9*    Cardiac Enzymes Recent Labs  Lab 01/10/2018 2013 01-10-18 2154 12/26/17 0343  TROPONINI 0.59* 0.48* 0.31*    Glucose Recent Labs  Lab 12/26/17 0147 12/26/17 0242 12/26/17 0341 12/26/17 0453 12/26/17 0540  12/26/17 0645  GLUCAP 206* 191* 182* 165* 159* 161*    Imaging Ct Head Wo Contrast  Result Date: 2018/01/21 CLINICAL DATA:  Poly trauma, head injury. Status post cardiac arrest EXAM: CT HEAD WITHOUT CONTRAST CT CERVICAL SPINE WITHOUT CONTRAST TECHNIQUE: Multidetector CT imaging of the head and cervical spine was performed following the standard protocol without intravenous contrast. Multiplanar CT image reconstructions of the cervical spine were also generated. COMPARISON:  None. FINDINGS: CT HEAD FINDINGS Brain: No evidence of acute infarction, hemorrhage, hydrocephalus, extra-axial collection or mass lesion/mass effect. Vascular: No hyperdense vessel or unexpected calcification. Skull: No osseous abnormality. Sinuses/Orbits: Bilateral maxillary sinus mucous retention cysts. Bilateral ethmoid sinus mucosal thickening.  Visualized mastoid sinuses are clear. Visualized orbits demonstrate no focal abnormality. Other: None CT CERVICAL SPINE FINDINGS Alignment: 3 mm anterolisthesis of T1 on T2. Skull base and vertebrae: No acute fracture. No primary bone lesion or focal pathologic process. Soft tissues and spinal canal: No prevertebral fluid or swelling. No visible canal hematoma. Disc levels: Degenerative disc disease with severe disc height loss at C5-6 and C6-7. Mild degenerative disc disease with disc height loss at C4-5. Osseous fusion of the posterior elements of C2-3. Mild broad-based disc bulge at C3-4 with moderate bilateral facet arthropathy. Bilateral uncovertebral degenerative changes at C4-5 and C5-6 with bilateral foraminal stenosis. Upper chest: Small right pleural effusion. Endotracheal tube partially visualized. Nasogastric tube partially visualized. Other: No fluid collection or hematoma. IMPRESSION: 1. No acute intracranial pathology. 2.  No acute osseous injury of the cervical spine. 3. Cervical spine spondylosis as described above. Electronically Signed   By: Elige Ko   On: 01/21/2018 18:31   Ct Cervical Spine Wo Contrast  Result Date: 2018-01-21 CLINICAL DATA:  Poly trauma, head injury. Status post cardiac arrest EXAM: CT HEAD WITHOUT CONTRAST CT CERVICAL SPINE WITHOUT CONTRAST TECHNIQUE: Multidetector CT imaging of the head and cervical spine was performed following the standard protocol without intravenous contrast. Multiplanar CT image reconstructions of the cervical spine were also generated. COMPARISON:  None. FINDINGS: CT HEAD FINDINGS Brain: No evidence of acute infarction, hemorrhage, hydrocephalus, extra-axial collection or mass lesion/mass effect. Vascular: No hyperdense vessel or unexpected calcification. Skull: No osseous abnormality. Sinuses/Orbits: Bilateral maxillary sinus mucous retention cysts. Bilateral ethmoid sinus mucosal thickening. Visualized mastoid sinuses are clear. Visualized  orbits demonstrate no focal abnormality. Other: None CT CERVICAL SPINE FINDINGS Alignment: 3 mm anterolisthesis of T1 on T2. Skull base and vertebrae: No acute fracture. No primary bone lesion or focal pathologic process. Soft tissues and spinal canal: No prevertebral fluid or swelling. No visible canal hematoma. Disc levels: Degenerative disc disease with severe disc height loss at C5-6 and C6-7. Mild degenerative disc disease with disc height loss at C4-5. Osseous fusion of the posterior elements of C2-3. Mild broad-based disc bulge at C3-4 with moderate bilateral facet arthropathy. Bilateral uncovertebral degenerative changes at C4-5 and C5-6 with bilateral foraminal stenosis. Upper chest: Small right pleural effusion. Endotracheal tube partially visualized. Nasogastric tube partially visualized. Other: No fluid collection or hematoma. IMPRESSION: 1. No acute intracranial pathology. 2.  No acute osseous injury of the cervical spine. 3. Cervical spine spondylosis as described above. Electronically Signed   By: Elige Ko   On: 21-Jan-2018 18:31   Dg Chest Port 1 View  Result Date: 01-21-18 CLINICAL DATA:  67 y/o  M; post cardiac arrest.  ET tube placement. EXAM: PORTABLE CHEST 1 VIEW COMPARISON:  None. FINDINGS: Stable cardiac silhouette given projection and technique. Low lung volumes. No  consolidation, effusion, or pneumothorax identified. Endotracheal tube tip projects 3.7 cm above the carina. Left lung partially obscured by transcutaneous pacing pads. No acute osseous abnormality identified. IMPRESSION: Endotracheal tube 3.7 cm above the carina. Low lung volumes. No acute pulmonary process identified. Electronically Signed   By: Mitzi HansenLance  Furusawa-Stratton M.D.   On: 01/07/2018 17:02   Dg Abd Portable 1v  Result Date: 12/22/2017 CLINICAL DATA:  67 y/o  M; OG tube placement. EXAM: PORTABLE ABDOMEN - 1 VIEW COMPARISON:  None. FINDINGS: The bowel gas pattern is normal. No radio-opaque calculi or other  significant radiographic abnormality are seen. Enteric tube tip projects over distal stomach. IMPRESSION: Enteric tube tip projects over distal stomach. Electronically Signed   By: Mitzi HansenLance  Furusawa-Stratton M.D.   On: 01/08/2018 19:44     STUDIES:  Ct head >>> neg C spine CT >> spondylosis   SIGNIFICANT EVENTS: Cardiac arrest 01/10/2018   LINES/TUBES: Right femoral line 4/4  >> Arterial line 4/4 >>  DISCUSSION: [name] 67 yr old male with witnessed ventricular fibrillation arrest and acute hypoxemic respiratory failure requiring mechanical ventilation , on hypothermia protocol   ASSESSMENT / PLAN:  PULMONARY A: Acute hypoxemic resp failure requiring mechanical ventilation   P:   Vent protocol/bundle - lower FIO2 -check ABG & increase RR if required  CARDIOVASCULAR A:  V fib arrest  Cardiogenic shock  P:  Levophed gtt for MAP 65 & above Heparin & Amiodarone drip  Cardiology following >> no cath for now    RENAL A:   AKI Hypokalemia  Acute metab acidosis P:   Follow renal function CVP high, dc NS, start bicarb gtt @ 75/h Replete K , while on cool  GASTROINTESTINAL A:    P:   NPO PPI for GI prophylaxis  TFs in 24h , if no extubation  HEMATOLOGIC A:   Leukocytosis  P:  Possible reactive   INFECTIOUS A:   No signs of infections  P:     ENDOCRINE A:   hyperglycemia P:   Insulin gtt, can transition off once rewarmed  NEUROLOGIC A:   Acute metabolic encephalopathy  Prolonged downtime >> concern for anoxic injury  P:   RASS goal: -5, while on paralytic Fent /versed gtt Rewarm to complete 9am 4/6    FAMILY  - Updates: none available   - Inter-disciplinary family meet or Palliative Care meeting due by:  12/31/2017  The patient is critically ill with multiple organ systems failure and requires high complexity decision making for assessment and support, frequent evaluation and titration of therapies, application of advanced monitoring  technologies and extensive interpretation of multiple databases. Critical Care Time devoted to patient care services described in this note independent of APP/resident  time is 35 minutes.   Cyril Mourningakesh Desmen Schoffstall MD. Tonny BollmanFCCP. [facility] Pager [phone] If no response call [phone]    12/26/2017, 8:19 AM

## 2017-12-26 NOTE — Progress Notes (Signed)
[facility] FOR AM LAB REPLACEMENT ONLY  The patient does apply for the [facility] Electrolyte Replacment Protocol based on the criteria listed below:   1. Is GFR >/= 40 ml/min? Yes.    Patient's GFR today is 49 2. Is urine output >/= 0.5 ml/kg/hr for the last 6 hours? Yes.   Patient's UOP is .5 ml/kg/hr 3. Is BUN < 60 mg/dL? Yes.    Patient's BUN today is 21 4. Abnormal electrolyte(s): K-3.4 5. Ordered repletion with: per protocol 6. If a panic level lab has been reported, has the CCM MD in charge been notified? Yes.  .   Physician:  Dr. Heywood Benegan  Devin Green, Devin Green 12/26/2017 5:30 AM

## 2017-12-26 NOTE — Consult Note (Signed)
Neurology Consultation Reason for Consult: Seizures Referring Physician: Vassie Loll, R  CC: Seizures  History is obtained from:chart   HPI: Devin Green is a 67 y.o. male presented due to VFIB arrest after several days of chest pain. He was aparently down for up to about 5 minutes prior to bystander CPR. Downtime was around 23 minutes. He was started on cooling protocol. As par tof this protocol, he received an EEG which revealed frequent seizures.   ROS:  Unable to obtain due to altered mental status.   PMH: Unable to assess secondary to patient's altered mental status.   FHx:  Unable to obtain due to AMS.   Social History:  reports that he has never smoked. He has never used smokeless tobacco. He reports that he does not use drugs. His alcohol history is not on file.   Exam: Current vital signs: BP 134/77   Pulse 93   Temp (!) 92.1 F (33.4 C)   Resp (!) 22   Ht 6' (1.829 m)   Wt 110.2 kg (242 lb 15.5 oz)   SpO2 94%   BMI 32.95 kg/m  Vital signs in last 24 hours: Temp:  [89.8 F (32.1 C)-96.9 F (36.1 C)] 92.1 F (33.4 C) (04/05 0900) Pulse Rate:  [72-93] 93 (04/05 0710) Resp:  [14-40] 22 (04/05 0945) BP: (99-152)/(62-92) 134/77 (04/05 0900) SpO2:  [94 %-100 %] 94 % (04/05 0945) Arterial Line BP: (85-161)/(56-76) 100/59 (04/05 0945) FiO2 (%):  [70 %-100 %] 70 % (04/05 0710) Weight:  [110.2 kg (242 lb 15.5 oz)-113.4 kg (250 lb)] 110.2 kg (242 lb 15.5 oz) (04/05 0400)   Physical Exam  Constitutional: Appears well-developed and well-nourished.  Psych: unresopnsive Eyes: No scleral injection HENT: intubated Head: Normocephalic.  Cardiovascular: Normal rate and regular rhythm.  Respiratory: ventilated GI: Soft.  No distension. There is no tenderness.  Skin: WDI   Neuro:SEVERELY LIMITED DUE TO PARALYTIC Mental Status: Does not open eyes or follow commands.  Patient is Cranial Nerves: NW:GNFAOZ are very sluggish, but minimally reactive bilaterally.  No  corneals(of uncertain significance given paralytic) Motor: Does not respond to nox stim.  Sensory: As above Cerebellar: Does nto perform.     I have reviewed labs in epic and the results pertinent to this consultation are: Calcium corrects to 8.7 Mg 2.1  Na 135  I have reviewed the images obtained: CT head - negative  EEG- frequent seizures with attenuated background.   Impression: 67 yo M with seizures s/p cardiac arrest. I am highly concerned for severe anoxic brain injury given the pattern on EEG, but I am not certain if this represents myoclonic status epilepticus given the paralytic. I would favor attempting to control the seizures for now, but if this proves to be super-refractory status epilepticus, then I think that this would portend a poor prognosis given the flat background outside of the seizure activity. .   Recommendations: 1)  Depacon load close to finishing, still seizing 2) Load with keppra in addition.  3) Use versed temporarily while other agents are infusing, may need higher rate to control sz 4) would not use longer acting agents such as pentobarbital.  5) once stabilized from seizure standpoint, repeat head CT.  6) Will follow.     This patient is critically ill and at significant risk of neurological worsening, death and care requires constant monitoring of vital signs, hemodynamics,respiratory and cardiac monitoring, neurological assessment, discussion with family, other specialists and medical decision making of high complexity. I  spent 60 minutes of neurocritical care time  in the care of  this patient.  Ritta SlotMcNeill Beatriz Quintela, MD [facility] [phone]  If 7pm- 7am, please page neurology on call as listed in AMION. 12/26/2017  10:51 AM

## 2017-12-26 NOTE — Procedures (Signed)
ELECTROENCEPHALOGRAM REPORT  Date of Study: 1950/11/12  Patient's Name: Devin SheldonRobert A Clemmons Jr MRN: [id number] Date of Birth: 1950/11/12  Referring Provider: Dr. Mateo FlowWael Aljishi  Clinical History: This is a 67 year old man s/p cardiac arrest  Medications: Fentanyl Versed Depakote Nimbex  Technical Summary: A multichannel digital EEG recording measured by the international 10-20 system with electrodes applied with paste and impedances below 5000 ohms performed in our laboratory with EKG monitoring in an intubated and sedated patient.  Hyperventilation and photic stimulation were not performed.  The digital EEG was referentially recorded, reformatted, and digitally filtered in a variety of bipolar and referential montages for optimal display.    Description: The patient is intubated and sedated on Fentanyl and Versed, paralyzed on Nimbex during the recording. There is loss of normal background activity. At the beginning of the recording, there is diffuse theta and delta slowing with generalized periodic discharges occurring at a frequency of 1-2 Hz lasting 50 seconds. This is followed by diffuse background suppression lasting 23 seconds. Following this, the rest of the study shows an alternating pattern of electrographic seizure lasting 105-135 seconds consisting of generalized 2 Hz activity for 2-3 seconds followed by rhythmic generalized 8 Hz activity evolving in frequency and amplitude into 1-2 Hz generalized spikes and polyspike discharges, then alternating with diffuse background suppression lasting 80 seconds up to 9 minutes. No clinical changes seen, patient unresponsive throughout the study. Normal sleep architecture is not seen. No reactivity with stimulation. Hyperventilation and photic stimulation were not performed.  EKG lead was unremarkable.  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   Patrcia DollyKaren Lauriel Helin, M.D.

## 2017-12-26 NOTE — Progress Notes (Signed)
ANTICOAGULATION CONSULT NOTE  Pharmacy Consult for heparin Indication: chest pain/ACS  Patient Measurements: Height: 6' (182.9 cm) Weight: 242 lb 15.5 oz (110.2 kg) IBW/kg (Calculated) : 77.6 Heparin Dosing Weight: 100 kg   Vital Signs: Temp: 92.1 F (33.4 C) (04/05 0700) Temp Source: Bladder (04/05 0400) BP: 134/77 (04/05 0900) Pulse Rate: 82 (04/05 0400)  Labs: Recent Labs    2018/01/27 1640 2018/01/27 1654 2018/01/27 1838 2018/01/27 1900 2018/01/27 2013 2018/01/27 2154 12/26/17 0000 12/26/17 0147 12/26/17 0343 12/26/17 0808  HGB 15.1 16.0 15.3  --   --   --   --   --  16.7  --   HCT 45.1 47.0 45.0  --   --   --   --   --  48.6  --   PLT 238  --   --   --   --   --   --   --  229  --   APTT 26  --   --  27  --   --   --  59*  --   --   LABPROT 13.4  --   --  13.4  --   --   --  13.9  --   --   INR 1.03  --   --  1.03  --   --   --  1.07  --   --   HEPARINUNFRC  --   --   --   --   --   --   --  0.27*  --  0.39  CREATININE 1.74* 1.80* 1.60* 1.73*  --  1.41* 1.46*  --  1.45*  --   TROPONINI 0.06*  --   --  0.54* 0.59* 0.48*  --   --  0.31*  --    Assessment: 66 yoM post-cardiac arrest on [place] cooling protocol. Patient with mild troponin elevation and ST elevation. Pharmacy is consulted to start IV heparin for ACS. Pending neurologic improvement to make decision for cath. Heparin level is now within goal range at 0.39 after rate increase overnight. CBC WNL, stable. No bleeding noted  Goal of Therapy:  Heparin level 0.3-0.7 units/ml Monitor platelets by anticoagulation protocol: Yes   Plan:  Continue heparin gtt at 1000 units/hr Daily heparin level and CBC Monitor for s/sx of bleeding F/u rewarming and plan for cath  Erin N. Zigmund Danieleja, PharmD PGY1 Pharmacy Resident Pager: [phone] 12/26/2017,9:23 AM

## 2017-12-26 NOTE — Progress Notes (Signed)
Notified ELink MD of patient's 2000 ABG results. No new orders given. Will continue to monitor patient.

## 2017-12-26 NOTE — Progress Notes (Signed)
Verified rewarming start time at 2100 with Hillery AldoShanna Stowe, RN.

## 2017-12-26 NOTE — Progress Notes (Signed)
Per Dr. Amada JupiterKirkpatrick a total of 50 mg of versed was bolused to patient (Two 5 mg boluses and four 10 mg boluses).

## 2017-12-26 NOTE — Progress Notes (Signed)
EEG Complete. Results pending. 

## 2017-12-26 NOTE — Progress Notes (Signed)
ANTICOAGULATION CONSULT NOTE  Pharmacy Consult for heparin Indication: chest pain/ACS  Patient Measurements: Height: 6' (182.9 cm) Weight: 248 lb 0.3 oz (112.5 kg)(-2.39 for arctic pads) IBW/kg (Calculated) : 77.6 Heparin Dosing Weight: 100 kg   Vital Signs: Temp: 91.6 F (33.1 C) (04/05 0200) Temp Source: Bladder (04/05 0000) BP: 99/62 (04/05 0200) Pulse Rate: 72 (04/05 0000)  Labs: Recent Labs    12/26/2017 1640 12/22/2017 1654 01/11/2018 1838 12/27/2017 1900 01/17/2018 2013 01/20/2018 2154 12/26/17 0000 12/26/17 0147  HGB 15.1 16.0 15.3  --   --   --   --   --   HCT 45.1 47.0 45.0  --   --   --   --   --   PLT 238  --   --   --   --   --   --   --   APTT 26  --   --  27  --   --   --  59*  LABPROT 13.4  --   --  13.4  --   --   --  13.9  INR 1.03  --   --  1.03  --   --   --  1.07  HEPARINUNFRC  --   --   --   --   --   --   --  0.27*  CREATININE 1.74* 1.80* 1.60* 1.73*  --  1.41* 1.46*  --   TROPONINI 0.06*  --   --  0.54* 0.59* 0.48*  --   --      Assessment: 66 YOM post cardiac arrest on [place] cooling protocol, pharmacy is consulted to start IV heparin for ACS, pt with mild troponin elevation and ST elevation. Head-CT no bleeding. Pending neurologic improvement to make decision for cath.   Initial heparin level is SUBetherapeutic.   Goal of Therapy:  Heparin level 0.3-0.7 units/ml Monitor platelets by anticoagulation protocol: Yes   Plan:  - Increase heparin to 1000 units/hr - f/u 6 hr heparin level - f/u rewarming and plan for cath    Baldemar FridayMasters, Akaya Proffit M 12/26/2017,2:52 AM

## 2017-12-27 ENCOUNTER — Inpatient Hospital Stay (HOSPITAL_COMMUNITY): Payer: Medicare Other

## 2017-12-27 DIAGNOSIS — G931 Anoxic brain damage, not elsewhere classified: Secondary | ICD-10-CM

## 2017-12-27 LAB — PHOSPHORUS
PHOSPHORUS: 2.2 mg/dL — AB (ref 2.5–4.6)
Phosphorus: 2.7 mg/dL (ref 2.5–4.6)

## 2017-12-27 LAB — MAGNESIUM
MAGNESIUM: 1.4 mg/dL — AB (ref 1.7–2.4)
Magnesium: 1.5 mg/dL — ABNORMAL LOW (ref 1.7–2.4)

## 2017-12-27 LAB — GLUCOSE, CAPILLARY
GLUCOSE-CAPILLARY: 127 mg/dL — AB (ref 65–99)
GLUCOSE-CAPILLARY: 129 mg/dL — AB (ref 65–99)
GLUCOSE-CAPILLARY: 130 mg/dL — AB (ref 65–99)
GLUCOSE-CAPILLARY: 131 mg/dL — AB (ref 65–99)
GLUCOSE-CAPILLARY: 131 mg/dL — AB (ref 65–99)
GLUCOSE-CAPILLARY: 132 mg/dL — AB (ref 65–99)
GLUCOSE-CAPILLARY: 142 mg/dL — AB (ref 65–99)
Glucose-Capillary: 122 mg/dL — ABNORMAL HIGH (ref 65–99)
Glucose-Capillary: 118 mg/dL — ABNORMAL HIGH (ref 65–99)
Glucose-Capillary: 119 mg/dL — ABNORMAL HIGH (ref 65–99)
Glucose-Capillary: 138 mg/dL — ABNORMAL HIGH (ref 65–99)
Glucose-Capillary: 123 mg/dL — ABNORMAL HIGH (ref 65–99)
Glucose-Capillary: 143 mg/dL — ABNORMAL HIGH (ref 65–99)

## 2017-12-27 LAB — BASIC METABOLIC PANEL
ANION GAP: 13 (ref 5–15)
ANION GAP: 11 (ref 5–15)
ANION GAP: 9 (ref 5–15)
Anion gap: 13 (ref 5–15)
BUN: 12 mg/dL (ref 6–20)
BUN: 15 mg/dL (ref 6–20)
BUN: 16 mg/dL (ref 6–20)
BUN: 17 mg/dL (ref 6–20)
CALCIUM: 6.9 mg/dL — AB (ref 8.9–10.3)
CALCIUM: 7.2 mg/dL — AB (ref 8.9–10.3)
CHLORIDE: 108 mmol/L (ref 101–111)
CHLORIDE: 108 mmol/L (ref 101–111)
CO2: 15 mmol/L — ABNORMAL LOW (ref 22–32)
CO2: 16 mmol/L — AB (ref 22–32)
CO2: 19 mmol/L — AB (ref 22–32)
CO2: 22 mmol/L (ref 22–32)
CREATININE: 1.36 mg/dL — AB (ref 0.61–1.24)
Calcium: 7.1 mg/dL — ABNORMAL LOW (ref 8.9–10.3)
Calcium: 7 mg/dL — ABNORMAL LOW (ref 8.9–10.3)
Chloride: 106 mmol/L (ref 101–111)
Chloride: 108 mmol/L (ref 101–111)
Creatinine, Ser: 1.31 mg/dL — ABNORMAL HIGH (ref 0.61–1.24)
Creatinine, Ser: 1.38 mg/dL — ABNORMAL HIGH (ref 0.61–1.24)
Creatinine, Ser: 1.38 mg/dL — ABNORMAL HIGH (ref 0.61–1.24)
GFR calc Af Amer: >60 mL/min (ref >60)
GFR calc Af Amer: >60 mL/min (ref >60)
GFR calc non Af Amer: 52 mL/min — ABNORMAL LOW (ref >60)
GFR calc non Af Amer: 52 mL/min — ABNORMAL LOW (ref >60)
GFR calc non Af Amer: 56 mL/min — ABNORMAL LOW (ref >60)
GFR, EST NON AFRICAN AMERICAN: 53 mL/min — AB (ref >60)
GLUCOSE: 132 mg/dL — AB (ref 65–99)
GLUCOSE: 137 mg/dL — AB (ref 65–99)
Glucose, Bld: 132 mg/dL — ABNORMAL HIGH (ref 65–99)
Glucose, Bld: 130 mg/dL — ABNORMAL HIGH (ref 65–99)
POTASSIUM: 3.3 mmol/L — AB (ref 3.5–5.1)
POTASSIUM: 3.6 mmol/L (ref 3.5–5.1)
Potassium: 3.2 mmol/L — ABNORMAL LOW (ref 3.5–5.1)
Potassium: 3.8 mmol/L (ref 3.5–5.1)
SODIUM: 137 mmol/L (ref 135–145)
SODIUM: 138 mmol/L (ref 135–145)
Sodium: 136 mmol/L (ref 135–145)
Sodium: 137 mmol/L (ref 135–145)

## 2017-12-27 LAB — CBC
HEMATOCRIT: 44 % (ref 39.0–52.0)
HEMOGLOBIN: 15.1 g/dL (ref 13.0–17.0)
MCH: 30.8 pg (ref 26.0–34.0)
MCHC: 34.3 g/dL (ref 30.0–36.0)
MCV: 89.8 fL (ref 78.0–100.0)
Platelets: 233 10*3/uL (ref 150–400)
RBC: 4.9 MIL/uL (ref 4.22–5.81)
RDW: 13.7 % (ref 11.5–15.5)
WBC: 15.8 10*3/uL — ABNORMAL HIGH (ref 4.0–10.5)

## 2017-12-27 LAB — HEPARIN LEVEL (UNFRACTIONATED): Heparin Unfractionated: 0.47 IU/mL (ref 0.30–0.70)

## 2017-12-27 LAB — POCT I-STAT 3, ART BLOOD GAS (G3+)
Acid-base deficit: 9 mmol/L — ABNORMAL HIGH (ref 0.0–2.0)
Bicarbonate: 15.8 mmol/L — ABNORMAL LOW (ref 20.0–28.0)
O2 Saturation: 95 %
PCO2 ART: 27.2 mmHg — AB (ref 32.0–48.0)
TCO2: 17 mmol/L — ABNORMAL LOW (ref 22–32)
pH, Arterial: 7.36 (ref 7.350–7.450)
pO2, Arterial: 66 mmHg — ABNORMAL LOW (ref 83.0–108.0)

## 2017-12-27 MED ORDER — PRO-STAT SUGAR FREE PO LIQD
30.0000 mL | Freq: Two times a day (BID) | ORAL | Status: DC
  Administered 2017-12-27: 30 mL
  Filled 2017-12-27: qty 30

## 2017-12-27 MED ORDER — PRO-STAT SUGAR FREE PO LIQD
60.0000 mL | Freq: Three times a day (TID) | ORAL | Status: DC
  Administered 2017-12-27 (×2): 60 mL
  Filled 2017-12-27 (×2): qty 60

## 2017-12-27 MED ORDER — VITAL HIGH PROTEIN PO LIQD: 1000.0000 mL | ORAL | Status: DC

## 2017-12-27 MED ORDER — INSULIN ASPART 100 UNIT/ML ~~LOC~~ SOLN
2.0000 [IU] | SUBCUTANEOUS | Status: DC
  Administered 2017-12-27 (×3): 2 [IU] via SUBCUTANEOUS

## 2017-12-27 MED ORDER — DEXTROSE 5 % IV SOLN
INTRAVENOUS | Status: DC
  Administered 2017-12-28: 1 mL via INTRAVENOUS

## 2017-12-27 MED ORDER — VITAL HIGH PROTEIN PO LIQD
1000.0000 mL | ORAL | Status: DC
  Administered 2017-12-27: 1000 mL

## 2017-12-27 MED ORDER — INSULIN ASPART 100 UNIT/ML ~~LOC~~ SOLN: 2.0000 [IU] | SUBCUTANEOUS | Status: DC

## 2017-12-27 MED ORDER — INSULIN DETEMIR 100 UNIT/ML ~~LOC~~ SOLN
5.0000 [IU] | Freq: Two times a day (BID) | SUBCUTANEOUS | Status: DC
  Administered 2017-12-27 (×2): 5 [IU] via SUBCUTANEOUS
  Filled 2017-12-27 (×3): qty 0.05

## 2017-12-27 MED ORDER — INSULIN DETEMIR 100 UNIT/ML ~~LOC~~ SOLN: 5.0000 [IU] | Freq: Two times a day (BID) | SUBCUTANEOUS | Status: DC

## 2017-12-27 NOTE — Procedures (Deleted)
History: Anoxic brain injury  Sedation: Initially low-dose Versed, later high-dose Versed and ketamine  Technique: This is a 21 channel continuous video EEG that recorded from 9:55 AM on 4/5 until 7:30 AM on 4/6 performed at the bedside with bipolar and monopolar montages arranged in accordance to the international 10/20 system of electrode placement. One channel was dedicated to EKG recording.    Background: At the onset of recording the background is completely flat interspersed with generalized seizures lasting 1-2 minutes.  The under seizure interval is 5-25 minutes with completely flat background in between.  Subsequently generalized periodic discharges with epileptiform morphology are introduced into the antiseizure interval as well as occasional short (less than 1 second) bursts of delta activity.  This pattern continues until approximately 1:15 PM.  Following this there is a period of relative attenuation and blunting of the generalized periodic discharges, though the epileptiform morphology rapidly returns.  Following this there is a GPDs plus fast activity pattern superimposed on an otherwise flat background.  Brief, approximately 10 seconds, seizures are then seen interspersed with the generalized periodic epileptiform discharges.  Last seizure was seen at 4:56 PM.  Throughout the remainder of the recording there is a GPD pattern with epileptiform morphology.  Photic stimulation: Physiologic driving is not performed  EEG Abnormalities: 1) Frequent generalized seizures at onset 2) Generalized periodic epileptiform discharges 3) Attenuated background  Clinical Interpretation: This EEG is severely abnormal with evidence of severe cerebral dysfunction in association with multiple generalized seizures.  The generalized periodic discharges superimposed on a flat background seen in the remainder of the recording after seizures are stopped is generally associated with poor prognosis in post cardiac  arrest survivors.  Medication would not be directly associated with the discharges, but could have an effect on the underlying background.  Ritta SlotMcNeill Cyndal Kasson, MD [facility] [phone]  If 7pm- 7am, please page neurology on call as listed in AMION.

## 2017-12-27 NOTE — Progress Notes (Addendum)
Subjective: Paralytic turned off just as I was initiating my exam  Exam: Vitals:   12/27/17 0725 12/27/17 0800  BP:  121/73  Pulse:    Resp:  (!) 22  Temp: (!) 96.8 F (36 C) (!) 97.3 F (36.3 C)  SpO2:  95%   Gen: In bed, intubated Resp: Ventilated Abd: soft, nt  Neuro: Limited by paralytic MS: Does not open eyes or follow commands CN: Pupils sluggish but minimally reactive, corneals absent Motor: No response  Pertinent Labs: Creatinine 1.38  Impression: 67 year old male with anoxic brain injury status post cardiac arrest.  The pattern seen on EEG is very concerning for severe anoxic brain injury.  Initially, though consistent with status epilepticus given the lack of return to baseline between seizures, there was a very prolonged period between seizures up to 25 minutes with completely flat background, and this is prior to high-dose sedatives which would confound this.  The actual evolving seizure activity has been controlled with antiepileptics and sedatives, but there is still evidence suggestive of severe cerebral injury.  I am hesitant to make formal prognosis prior to getting an exam, however if he does began having clear evolving seizures again, I would not favor restarting sedative medications as I do not feel that they would be likely to change outcome.  Recommendations: 1) wean sedatives 2) continue Vimpat, Keppra, Depakote 3) CT head  This patient is critically ill and at significant risk of neurological worsening, death and care requires constant monitoring of vital signs, hemodynamics,respiratory and cardiac monitoring, neurological assessment, discussion with family, other specialists and medical decision making of high complexity. I spent 35 minutes of neurocritical care time  in the care of  this patient.  Ritta SlotMcNeill Ernesteen Mihalic, MD [facility] [phone]  If 7pm- 7am, please page neurology on call as listed in AMION. 12/27/2017  8:18 AM

## 2017-12-27 NOTE — Progress Notes (Signed)
LTM EEG D/C'd this am so pt could go to CT. Pt returned from CT and LTM EEG was restarted, new electrodes used.

## 2017-12-27 NOTE — Progress Notes (Signed)
Pt transported on vent to CT and returned to 2H03.  Pt's vitals remained stable throughout.

## 2017-12-27 NOTE — Progress Notes (Signed)
TTM therapy stopped @ 10:30 for CT head.  Resumed TTM @ 11:15. Target temperature met @ 11:15. [place], Ardeth Sportsman

## 2017-12-27 NOTE — Progress Notes (Signed)
Notified ELink MD of patient's potassium level 3.2. No new orders due to rewarming.

## 2017-12-27 NOTE — Progress Notes (Signed)
PULMONARY / CRITICAL CARE MEDICINE   Name: Devin Green MRN: [id number] DOB: Aug 13, 1951    ADMISSION DATE:  01/15/2018 CONSULTATION DATE:  01/02/2018  REFERRING MD: ED  CHIEF COMPLAINT:  Cardiac arrest  HISTORY OF PRESENT ILLNESS:    67 yr old male with no available past history coming in after witnessed cardiac arrest. He was working in the yard with his family were he collapsed and CPR started immediately and when EMS arrived he was shocked twice. He was down for around before ROSC. Upon arrival to the ED he was in sinus rhythm but GCS 3. Agitated, biting on the tube , started on propofol & then cooled EKG >> ST elevation avR, lateral T wave inversions   SUBJ-  Sedation/paralytic off this am at 7am Remains unresponsive  Apneic on my SBT  Remains on levophed -  VITAL SIGNS: BP 112/70   Pulse 77   Temp 97.9 F (36.6 C)   Resp (!) 22   Ht 6' (1.829 m)   Wt 116.4 kg (256 lb 10.2 oz)   SpO2 96%   BMI 34.81 kg/m   HEMODYNAMICS: CVP:  [12 mmHg-20 mmHg] 20 mmHg  VENTILATOR SETTINGS: Vent Mode: PRVC FiO2 (%):  [40 %-60 %] 40 % Set Rate:  [22 bmp] 22 bmp Vt Set:  [600 mL] 600 mL PEEP:  [5 cmH20] 5 cmH20 Plateau Pressure:  [21 cmH20] 21 cmH20  INTAKE / OUTPUT: I/O last 3 completed shifts: In: 8745.7 [I.V.:8045.7; Other:10; NG/GT:150; IV Piggyback:540] Out: 2400 [Urine:2300; Emesis/NG output:100]  PHYSICAL EXAMINATION: Gen. wdwn male, NAD on vent ENT -c collar, ETT Neck: No JVD, no thyromegaly, no carotid bruits Lungs: resps even non labored on vent, few scattered rhonchi, apneic on SBT  Cardiovascular: s1s2 rrr, no murmurs or gallops, no peripheral edema Abdomen: soft and non-tender, no hepatosplenomegaly, BS normal. Musculoskeletal: No deformities, no cyanosis or clubbing Neuro:  Unresponsive off sedation since 7am   LABS:  BMET Recent Labs  Lab 12/26/17 2349 12/27/17 0302 12/27/17 0745  NA 136 137 138  K 3.2* 3.3* 3.6  CL 108 108 108   CO2 15* 16* 19*  BUN 17 15 16   CREATININE 1.31* 1.38* 1.38*  GLUCOSE 137* 132* 130*    Electrolytes Recent Labs  Lab 12/26/2017 1640  12/26/17 2349 12/27/17 0302 12/27/17 0745  CALCIUM 7.1*   < > 7.2* 7.1* 7.0*  MG 2.1  --   --  1.5*  --   PHOS  --   --   --  2.7  --    < > = values in this interval not displayed.    CBC Recent Labs  Lab 12/24/2017 1640  12/26/17 0343 12/26/17 1818 12/27/17 0302  WBC 12.2*  --  19.7*  --  15.8*  HGB 15.1   < > 16.7 16.0 15.1  HCT 45.1   < > 48.6 47.0 44.0  PLT 238  --  229  --  233   < > = values in this interval not displayed.    Coag's Recent Labs  Lab 01/06/2018 1640 01/03/2018 1900 12/26/17 0147  APTT 26 27 59*  INR 1.03 1.03 1.07    Sepsis Markers Recent Labs  Lab 01/13/2018 1656 12/22/2017 2101  LATICACIDVEN 8.40* 2.6*    ABG Recent Labs  Lab 12/26/17 1545 12/26/17 2004 12/27/17 0258  PHART 7.230* 7.287* 7.360  PCO2ART 35.9 27.3* 27.2*  PO2ART 74.9* 61.0* 66.0*    Liver Enzymes Recent Labs  Lab 01/01/2018  1640  AST 45*  ALT 29  ALKPHOS 71  BILITOT 0.9  ALBUMIN 2.9*    Cardiac Enzymes Recent Labs  Lab 12/31/2017 2154 12/26/17 0343 12/26/17 1125  TROPONINI 0.48* 0.31* 0.20*    Glucose Recent Labs  Lab 12/26/17 2150 12/26/17 2249 12/26/17 2349 12/27/17 0049 12/27/17 0148 12/27/17 0256  GLUCAP 146* 132* 129* 131* 142* 131*    Imaging Dg Chest Port 1 View  Result Date: 12/27/2017 CLINICAL DATA:  Acute respiratory failure EXAM: PORTABLE CHEST 1 VIEW COMPARISON:  01/18/2018 FINDINGS: Endotracheal tube in good position.  NG tube in the stomach. Progression of right lower lobe collapse since the prior study. Possible occlusion of right lower lobe bronchus. Progression of left lower lobe atelectasis. Negative for edema or effusion IMPRESSION: Progression of bibasilar atelectasis right greater than left. Electronically Signed   By: Marlan Palauharles  Clark M.D.   On: 12/27/2017 07:12     STUDIES:  Ct head >>>  neg C spine CT >> spondylosis EEG 4/6>>> CT  Head 4/6>>>  SIGNIFICANT EVENTS: Cardiac arrest 01/11/2018   LINES/TUBES: Right femoral line 4/4  >> Arterial line 4/4 >>  DISCUSSION: Mr Devin Green 67 yr old male with witnessed ventricular fibrillation arrest and acute hypoxemic respiratory failure requiring mechanical ventilation , on hypothermia protocol   ASSESSMENT / PLAN:  PULMONARY A: Acute hypoxemic resp failure requiring mechanical ventilation   P:   Vent support - 8cc/kg  F/u CXR  F/u ABG No weaning  F/u ABG   CARDIOVASCULAR A:  V fib arrest  Cardiogenic shock  P:  Levophed gtt for MAP 65 & above Heparin & Amiodarone drip  Cardiology following - Will need cath if neuro status improves Trend CVP  Heparin per pharmacy   RENAL A:   AKI Hypokalemia  Acute metab acidosis P:   Continue HCO3 gtt  F/u chem    GASTROINTESTINAL A:   P:   NPO PPI for GI prophylaxis  TF   HEMATOLOGIC A:   Leukocytosis  P:  F/u CBC  Heparin gtt per pharmacy   INFECTIOUS A:   No signs of infections  P:   Trend WBC, fever curve   ENDOCRINE A:   hyperglycemia P:   Insulin gtt off   NEUROLOGIC A:   Acute metabolic encephalopathy  Prolonged downtime >> concern for anoxic injury  P:   RASS goal: -1 Off fent/versed  Remains on ketamine per neuro - ?wean  F/u repeat CT today  Neuro following   FAMILY  - Updates: daughter and daughter in law updated at length at bedside 4/6  - Inter-disciplinary family meet or Palliative Care meeting due by:  12/31/2017  Dirk DressKaty Ellisa Devivo, NP 12/27/2017  9:54 AM Pager: [phone] or [phone]

## 2017-12-27 NOTE — Progress Notes (Signed)
Nutrition Follow-up  DOCUMENTATION CODES:  Obesity unspecified  INTERVENTION:  Initiate TF via OGT/NGT with Vital High Protein at goal rate of 35 ml/h (840 ml per day) and Prostat 60 ml TID to provide 1440 kcals, 163 gm protein, 702 ml free water daily.   NUTRITION DIAGNOSIS:  Inadequate oral intake related to inability to eat as evidenced by NPO status.  GOAL:  Provide needs based on ASPEN/SCCM guidelines  MONITOR:  I & O's, Vent status  REASON FOR ASSESSMENT:  Consult Enteral/tube feeding initiation and management  ASSESSMENT:  Pt admitted after witnessed cardiac arrest while working in his yard.    Patient remains unresponsive. Concern for anoxic injury.   RD consulted for tube feeding  Dry weight since admission is 243 lbs, though outlier, this wt is noted as measured w/ bed scale. Readjust needs based on new dry weight.  Patient is currently intubated on ventilator support MV: 13.1 L/min Temp (24hrs), Avg:95.1 F (35.1 C), Min:91.4 F (33 C), Max:98.8 F (37.1 C) Propofol: None  Labs: BGs: 115-140, Creat: 1.38 (stable)  Meds: Infusions:  Ketamine, Lacosamide, Keppra, Bicarb, Vital high protein Sedation/analgesics:  Nimbex/fentanyl/versed currently held Pressor support: Levophed  Recent Labs  Lab 27-Oct-2017 1640  12/26/17 2349 12/27/17 0302 12/27/17 0745  NA 140   < > 136 137 138  K 3.6   < > 3.2* 3.3* 3.6  CL 111   < > 108 108 108  CO2 14*   < > 15* 16* 19*  BUN 18   < > 17 15 16   CREATININE 1.74*   < > 1.31* 1.38* 1.38*  CALCIUM 7.1*   < > 7.2* 7.1* 7.0*  MG 2.1  --   --  1.5*  --   PHOS  --   --   --  2.7  --   GLUCOSE 193*   < > 137* 132* 130*   < > = values in this interval not displayed.   Diet Order:  Diet NPO time specified  EDUCATION NEEDS:   No education needs have been identified at this time  Skin:  Skin Assessment: Reviewed RN Assessment  Last BM:  Unknown  Height:  Ht Readings from Last 1 Encounters:  27-Oct-2017 6' (1.829 m)    Weight:  Wt Readings from Last 1 Encounters:  12/27/17 256 lb 10.2 oz (116.4 kg)   Ideal Body Weight:  80.91 kg  BMI:  Body mass index using dry weight is 33.0 kg/m.  Dosing/dry weight 110.45 kg or 243 lbs Estimated Nutritional Needs:  Kcal:  [id number] kcals Protein:  >161 g Pro Fluid:  Per MD goals  Christophe LouisNathan Harold Moncus RD, LDN, CNSC Clinical Nutrition Available Tues-Sat via Pager: [phone] 12/27/2017 12:36 PM

## 2017-12-27 NOTE — Progress Notes (Signed)
DAILY PROGRESS NOTE   Patient Name: Devin Green Date of Encounter: 12/27/2017  Chief Complaint   Intubated, sedated  Patient Profile   Devin Green is a 67 y.o. male with a reported hx of DM who is being seen today for the evaluation of out of hospital cardiac arrest,  at the request of Dr. Kathrynn Humble, Emergency Medicine.  Subjective   No overnight events. Versed and paralytic turned off this AM. Fentanyl still running. EEG hooked up. No changes  Objective   Vitals:   12/27/17 0645 12/27/17 0700 12/27/17 0725 12/27/17 0800  BP:  120/76  121/73  Pulse:      Resp: (!) 22 (!) 22  (!) 22  Temp:  (!) 96.6 F (35.9 C) (!) 96.8 F (36 C) (!) 97.3 F (36.3 C)  TempSrc:    Bladder  SpO2: 96% 97%  95%  Weight:      Height:        Intake/Output Summary (Last 24 hours) at 12/27/2017 3267 Last data filed at 12/27/2017 0800 Gross per 24 hour  Intake 6719.64 ml  Output 1875 ml  Net 4844.64 ml   Filed Weights   12/27/2017 1830 12/26/17 0400 12/27/17 0200  Weight: 248 lb 0.3 oz (112.5 kg) 242 lb 15.5 oz (110.2 kg) 256 lb 10.2 oz (116.4 kg)    Physical Exam   GEN: Well nourished, well developed, sedated  HEENT: normal  Neck: no JVD, carotid bruits, or masses Cardiac: RRR; no murmurs, rubs, or gallops, 2+ edema  Respiratory:  clear to auscultation bilaterally, normal work of breathing GI: soft, nontender, nondistended, + BS MS: no deformity or atrophy  Skin: warm and dry Neuro:  Unable to assess due to sedation Psych: unable to assess due to sedation   Inpatient Medications    Scheduled Meds: . amiodarone  150 mg Intravenous Once  . artificial tears  1 application Both Eyes T2W  . chlorhexidine gluconate (MEDLINE KIT)  15 mL Mouth Rinse BID  . Chlorhexidine Gluconate Cloth  6 each Topical Daily  . fentaNYL (SUBLIMAZE) injection  50 mcg Intravenous Once  . insulin aspart  2-6 Units Subcutaneous Q4H  . insulin detemir  5 Units Subcutaneous Q12H  . mouth  rinse  15 mL Mouth Rinse 10 times per day  . midazolam  1 mg Intravenous Once  . pantoprazole (PROTONIX) IV  40 mg Intravenous QHS  . sodium chloride flush  10-40 mL Intracatheter Q12H    Continuous Infusions: . sodium chloride    . sodium chloride    . cisatracurium (NIMBEX) infusion Stopped (12/27/17 0725)  . fentaNYL infusion INTRAVENOUS 200 mcg/hr (12/27/17 0700)  . heparin 1,000 Units/hr (12/27/17 0700)  . ketamine (KETALAR) IV infusion '2mg'$ /mL 2.5 mg/kg/hr (12/27/17 0812)  . lacosamide (VIMPAT) IV Stopped (12/26/17 2221)  . levETIRAcetam Stopped (12/26/17 2150)  . midazolam (VERSED) infusion Stopped (12/27/17 0735)  . norepinephrine (LEVOPHED) Adult infusion 38 mcg/min (12/27/17 0800)  .  sodium bicarbonate (isotonic) infusion in sterile water 75 mL/hr at 12/27/17 0700  . valproate sodium Stopped (12/27/17 0250)    PRN Meds: Place/Maintain arterial line **AND** sodium chloride, [COMPLETED] cisatracurium **AND** cisatracurium (NIMBEX) infusion **AND** cisatracurium, fentaNYL, fentaNYL (SUBLIMAZE) injection, fentaNYL (SUBLIMAZE) injection, midazolam, midazolam, midazolam, sodium chloride flush    ECG   SR, iRBBB, prolonged QTc - Personally Reviewed  Telemetry   Sinus rhythm - Personally Reviewed  Radiology    Ct Head Wo Contrast  Result Date: 12/27/2017 CLINICAL DATA:  Poly  trauma, head injury. Status post cardiac arrest EXAM: CT HEAD WITHOUT CONTRAST CT CERVICAL SPINE WITHOUT CONTRAST TECHNIQUE: Multidetector CT imaging of the head and cervical spine was performed following the standard protocol without intravenous contrast. Multiplanar CT image reconstructions of the cervical spine were also generated. COMPARISON:  None. FINDINGS: CT HEAD FINDINGS Brain: No evidence of acute infarction, hemorrhage, hydrocephalus, extra-axial collection or mass lesion/mass effect. Vascular: No hyperdense vessel or unexpected calcification. Skull: No osseous abnormality. Sinuses/Orbits:  Bilateral maxillary sinus mucous retention cysts. Bilateral ethmoid sinus mucosal thickening. Visualized mastoid sinuses are clear. Visualized orbits demonstrate no focal abnormality. Other: None CT CERVICAL SPINE FINDINGS Alignment: 3 mm anterolisthesis of T1 on T2. Skull base and vertebrae: No acute fracture. No primary bone lesion or focal pathologic process. Soft tissues and spinal canal: No prevertebral fluid or swelling. No visible canal hematoma. Disc levels: Degenerative disc disease with severe disc height loss at C5-6 and C6-7. Mild degenerative disc disease with disc height loss at C4-5. Osseous fusion of the posterior elements of C2-3. Mild broad-based disc bulge at C3-4 with moderate bilateral facet arthropathy. Bilateral uncovertebral degenerative changes at C4-5 and C5-6 with bilateral foraminal stenosis. Upper chest: Small right pleural effusion. Endotracheal tube partially visualized. Nasogastric tube partially visualized. Other: No fluid collection or hematoma. IMPRESSION: 1. No acute intracranial pathology. 2.  No acute osseous injury of the cervical spine. 3. Cervical spine spondylosis as described above. Electronically Signed   By: Kathreen Devoid   On: 12/22/2017 18:31   Ct Cervical Spine Wo Contrast  Result Date: 12/30/2017 CLINICAL DATA:  Poly trauma, head injury. Status post cardiac arrest EXAM: CT HEAD WITHOUT CONTRAST CT CERVICAL SPINE WITHOUT CONTRAST TECHNIQUE: Multidetector CT imaging of the head and cervical spine was performed following the standard protocol without intravenous contrast. Multiplanar CT image reconstructions of the cervical spine were also generated. COMPARISON:  None. FINDINGS: CT HEAD FINDINGS Brain: No evidence of acute infarction, hemorrhage, hydrocephalus, extra-axial collection or mass lesion/mass effect. Vascular: No hyperdense vessel or unexpected calcification. Skull: No osseous abnormality. Sinuses/Orbits: Bilateral maxillary sinus mucous retention cysts.  Bilateral ethmoid sinus mucosal thickening. Visualized mastoid sinuses are clear. Visualized orbits demonstrate no focal abnormality. Other: None CT CERVICAL SPINE FINDINGS Alignment: 3 mm anterolisthesis of T1 on T2. Skull base and vertebrae: No acute fracture. No primary bone lesion or focal pathologic process. Soft tissues and spinal canal: No prevertebral fluid or swelling. No visible canal hematoma. Disc levels: Degenerative disc disease with severe disc height loss at C5-6 and C6-7. Mild degenerative disc disease with disc height loss at C4-5. Osseous fusion of the posterior elements of C2-3. Mild broad-based disc bulge at C3-4 with moderate bilateral facet arthropathy. Bilateral uncovertebral degenerative changes at C4-5 and C5-6 with bilateral foraminal stenosis. Upper chest: Small right pleural effusion. Endotracheal tube partially visualized. Nasogastric tube partially visualized. Other: No fluid collection or hematoma. IMPRESSION: 1. No acute intracranial pathology. 2.  No acute osseous injury of the cervical spine. 3. Cervical spine spondylosis as described above. Electronically Signed   By: Kathreen Devoid   On: 12/31/2017 18:31   Dg Chest Port 1 View  Result Date: 12/27/2017 CLINICAL DATA:  Acute respiratory failure EXAM: PORTABLE CHEST 1 VIEW COMPARISON:  12/29/2017 FINDINGS: Endotracheal tube in good position.  NG tube in the stomach. Progression of right lower lobe collapse since the prior study. Possible occlusion of right lower lobe bronchus. Progression of left lower lobe atelectasis. Negative for edema or effusion IMPRESSION: Progression of bibasilar atelectasis right  greater than left. Electronically Signed   By: Franchot Gallo M.D.   On: 12/27/2017 07:12   Dg Chest Port 1 View  Result Date: 12/24/2017 CLINICAL DATA:  67 y/o  M; post cardiac arrest.  ET tube placement. EXAM: PORTABLE CHEST 1 VIEW COMPARISON:  None. FINDINGS: Stable cardiac silhouette given projection and technique. Low lung  volumes. No consolidation, effusion, or pneumothorax identified. Endotracheal tube tip projects 3.7 cm above the carina. Left lung partially obscured by transcutaneous pacing pads. No acute osseous abnormality identified. IMPRESSION: Endotracheal tube 3.7 cm above the carina. Low lung volumes. No acute pulmonary process identified. Electronically Signed   By: Kristine Garbe M.D.   On: 01/13/2018 17:02   Dg Abd Portable 1v  Result Date: 01/07/2018 CLINICAL DATA:  67 y/o  M; OG tube placement. EXAM: PORTABLE ABDOMEN - 1 VIEW COMPARISON:  None. FINDINGS: The bowel gas pattern is normal. No radio-opaque calculi or other significant radiographic abnormality are seen. Enteric tube tip projects over distal stomach. IMPRESSION: Enteric tube tip projects over distal stomach. Electronically Signed   By: Kristine Garbe M.D.   On: 01/02/2018 19:44    Cardiac Studies   N/A  Assessment   Active Problems:   Ventricular fibrillation (HCC)   Diabetes ([place])   Cardiac arrest ([place])   Anoxic brain injury ([place])   Plan   1. No ectopy overnight. Patient continued to be sedated. EEG ongoing. Continue with aspirin. Devin Green likely need cath pending neuro status. Plan to get TTE once levo has been turned off.    Length of Stay:  LOS: 2 days   Devin Green Meredith Leeds 12/27/2017, 8:32 AM

## 2017-12-27 NOTE — Progress Notes (Addendum)
ANTICOAGULATION CONSULT NOTE  Pharmacy Consult for heparin Indication: chest pain/ACS  Patient Measurements: Height: 6' (182.9 cm) Weight: 256 lb 10.2 oz (116.4 kg) IBW/kg (Calculated) : 77.6 Heparin Dosing Weight: 100 kg   Vital Signs: Temp: 98.1 F (36.7 C) (04/06 1400) Temp Source: Bladder (04/06 1400) BP: 109/73 (04/06 1400) Pulse Rate: 82 (04/06 1113)  Labs: Recent Labs    2018-03-20 1640  2018-03-20 1900  2018-03-20 2154  12/26/17 0147 12/26/17 0343 12/26/17 0808 12/26/17 1125 12/26/17 1818  12/26/17 2349 12/27/17 0302 12/27/17 0745  HGB 15.1   < >  --   --   --   --   --  16.7  --   --  16.0  --   --  15.1  --   HCT 45.1   < >  --   --   --   --   --  48.6  --   --  47.0  --   --  44.0  --   PLT 238  --   --   --   --   --   --  229  --   --   --   --   --  233  --   APTT 26  --  27  --   --   --  59*  --   --   --   --   --   --   --   --   LABPROT 13.4  --  13.4  --   --   --  13.9  --   --   --   --   --   --   --   --   INR 1.03  --  1.03  --   --   --  1.07  --   --   --   --   --   --   --   --   HEPARINUNFRC  --   --   --   --   --   --  0.27*  --  0.39  --   --   --   --  0.47  --   CREATININE 1.74*   < > 1.73*  --  1.41*   < >  --  1.45* 1.37* 1.33* 1.10   < > 1.31* 1.38* 1.38*  TROPONINI 0.06*  --  0.54*   < > 0.48*  --   --  0.31*  --  0.20*  --   --   --   --   --    < > = values in this interval not displayed.   Assessment: [age] yoM post-cardiac arrest and s/p [place] cooling protocol. Patient with mild troponin elevation and ST elevation. Pharmacy is consulted to dose IV heparin for ACS. Concern for anoxic brain injury, pending neurologic improvement to make decision for cath. Heparin level remains within goal range at 0.47. CBC WNL, stable. No bleeding noted  Goal of Therapy:  Heparin level 0.3-0.7 units/ml Monitor platelets by anticoagulation protocol: Yes   Plan:  Continue heparin gtt at 1000 units/hr Daily heparin level and CBC Monitor for s/sx  of bleeding F/u plan for cath  Erin N. Zigmund Danieleja, PharmD PGY1 Pharmacy Resident Pager: [phone] 12/27/2017,2:39 PM

## 2017-12-27 NOTE — Procedures (Signed)
lectroencephalogram report- LTM   Data acquisition: 10-20 electrode placement.  Additional T1, T2, and EKG electrodes; 26 channel digital referential acquisition reformatted to 18 channel/7 channel coronal bipolar     Spike detection: ON     Seizure detection: ON   Beginning time: 12/26/17 at 9 45 am  Ending time: 12/27/17 at 08 03 am   CPT: 95951 Day of study: day 1   This 21 hours of intensive EEG monitoring with simultaneous video monitoring was performed for this patient with cardiac arrest and anoxia to rule out seizures.  Medications: as per EMR, intubated    During first half of the recording background activities marked by suppressed background activities with superimposed electrographic seizures without obvious clinical accompaniment.  As recording progress electrographic seizures resolved leaving background activities marked by burst and suppression pattern.  Burst of mixed frequencies less than 1-3 seconds alternating with 3-5 seconds of suppressed background activities.  Superimposed near continuous periodic spike and polyspike and wave discharges present throughout the second half of the recording.  Clinical interpretation at this 22 hours of intensive EEG monitoring with simultaneous video monitoring for this patient with cardiac arrest and cerebral anoxia and marked by severe encephalopathy likely related to cerebral anoxia, seizures and later periodic spike and polyspike and wave discharges.  This finding suggestive of severe encephalopathy and significant cortical irritability.  In setting of cerebral anoxia this findings are suggestive of poor prognostic sign.  Clinical correlation is advised.

## 2017-12-27 NOTE — Progress Notes (Signed)
Regulation pressure alarming high on ventilator.  Small amount white tinged thin sputum suctioned.  O2 sat 80% on bedside ekg with good waveform.  Manually bagged x1 minute.  O2 sat up to 94%.  RT paged.   Fio2 increased to 100%.  Left lung with rhonchi, right lung diminished.  Will continue to closely monitor.

## 2017-12-28 ENCOUNTER — Inpatient Hospital Stay (HOSPITAL_COMMUNITY): Payer: Medicare Other

## 2017-12-28 DIAGNOSIS — G931 Anoxic brain damage, not elsewhere classified: Secondary | ICD-10-CM

## 2017-12-28 LAB — BASIC METABOLIC PANEL
Anion gap: 11 (ref 5–15)
BUN: 12 mg/dL (ref 6–20)
CALCIUM: 6.9 mg/dL — AB (ref 8.9–10.3)
CO2: 24 mmol/L (ref 22–32)
CREATININE: 1.21 mg/dL (ref 0.61–1.24)
Chloride: 102 mmol/L (ref 101–111)
GFR calc Af Amer: >60 mL/min (ref >60)
GLUCOSE: 124 mg/dL — AB (ref 65–99)
Potassium: 3.8 mmol/L (ref 3.5–5.1)
Sodium: 137 mmol/L (ref 135–145)

## 2017-12-28 LAB — PHOSPHORUS: PHOSPHORUS: 2.2 mg/dL — AB (ref 2.5–4.6)

## 2017-12-28 LAB — GLUCOSE, CAPILLARY
GLUCOSE-CAPILLARY: 110 mg/dL — AB (ref 65–99)
Glucose-Capillary: 108 mg/dL — ABNORMAL HIGH (ref 65–99)
Glucose-Capillary: 124 mg/dL — ABNORMAL HIGH (ref 65–99)

## 2017-12-28 LAB — HEPARIN LEVEL (UNFRACTIONATED)

## 2017-12-28 LAB — CBC
HCT: 39.9 % (ref 39.0–52.0)
Hemoglobin: 13.2 g/dL (ref 13.0–17.0)
MCH: 30.1 pg (ref 26.0–34.0)
MCHC: 33.1 g/dL (ref 30.0–36.0)
MCV: 91.1 fL (ref 78.0–100.0)
PLATELETS: 169 10*3/uL (ref 150–400)
RBC: 4.38 MIL/uL (ref 4.22–5.81)
RDW: 14.2 % (ref 11.5–15.5)
WBC: 11.7 10*3/uL — ABNORMAL HIGH (ref 4.0–10.5)

## 2017-12-28 LAB — MAGNESIUM: MAGNESIUM: 1.5 mg/dL — AB (ref 1.7–2.4)

## 2018-01-01 ENCOUNTER — Telehealth: Payer: Self-pay

## 2018-01-01 NOTE — Telephone Encounter (Signed)
On 01/01/18 I received a d/c from [employer] (original). The d/c is for cremation. The patient is a patient of Doctor Mannam. The d/c will be taken to Pulmonary Unit @ [facility] for signature.  On 01/02/18 I received the d/c back from Doctor Mannam. I got the d/c ready and called the funeral home to let them know the d/c is ready for pickup. I also faxed a copy to the funeral home per the funeral home request.

## 2018-01-21 NOTE — Progress Notes (Signed)
PULMONARY / CRITICAL CARE MEDICINE   Name: Devin SheldonRobert A Clemmons Jr MRN: [id number] DOB: [birth year]    ADMISSION DATE:  01/02/2018 CONSULTATION DATE:  01/03/2018  REFERRING MD: ED  CHIEF COMPLAINT:  Cardiac arrest  HISTORY OF PRESENT ILLNESS:    67 yr old male with no available past history coming in after witnessed cardiac arrest. He was working in the yard with his family were he collapsed and CPR started immediately and when EMS arrived he was shocked twice. He was down for around 23mins before ROSC. Upon arrival to the ED he was in sinus rhythm but GCS 3. Agitated, biting on the tube , started on propofol & then cooled EKG >> ST elevation avR, lateral T wave inversions   SUBJ-  Some subtle twitching ? Myoclonic jerks overnight-low-dose Versed started Per neurologist still with dismal neuro exam, dismal EEG   VITAL SIGNS: BP 113/72   Pulse 94   Temp 99.5 F (37.5 C) (Core)   Resp (!) 22   Ht 6' (1.829 m)   Wt 121.6 kg (268 lb 1.3 oz)   SpO2 94%   BMI 36.36 kg/m   HEMODYNAMICS: CVP:  [18 mmHg-24 mmHg] 18 mmHg  VENTILATOR SETTINGS: Vent Mode: PRVC FiO2 (%):  [40 %] 40 % Set Rate:  [22 bmp] 22 bmp Vt Set:  [600 mL] 600 mL PEEP:  [5 cmH20] 5 cmH20 Plateau Pressure:  [21 cmH20-27 cmH20] 27 cmH20  INTAKE / OUTPUT: I/O last 3 completed shifts: In: 8726.1 [I.V.:7519.1; NG/GT:617; IV Piggyback:590] Out: 2503 [Urine:2353; Emesis/NG output:150]  PHYSICAL EXAMINATION:. Gen:      No acute distress HEENT:  EOMI, sclera anicteric, ETT tube, C collar Neck:     No masses; no thyromegaly Lungs:    Clear to auscultation bilaterally; normal respiratory effort CV:         Regular rate and rhythm; no murmurs Abd:      + bowel sounds; soft, non-tender; no palpable masses, no distension Ext:    No edema; adequate peripheral perfusion Skin:      Warm and dry; no rash Neuro: Comatose, unresponsive  LABS:  BMET Recent Labs  Lab 12/27/17 0745 12/27/17 1619 03-10-2018 0430  NA 138  137 137  K 3.6 3.8 3.8  CL 108 106 102  CO2 19* 22 24  BUN 16 12 12   CREATININE 1.38* 1.36* 1.21  GLUCOSE 130* 132* 124*    Electrolytes Recent Labs  Lab 12/27/17 0302 12/27/17 0745 12/27/17 1212 12/27/17 1619 03-10-2018 0430  CALCIUM 7.1* 7.0*  --  6.9* 6.9*  MG 1.5*  --  1.4*  --  1.5*  PHOS 2.7  --  2.2*  --  2.2*    CBC Recent Labs  Lab 12/26/17 0343 12/26/17 1818 12/27/17 0302 03-10-2018 0430  WBC 19.7*  --  15.8* 11.7*  HGB 16.7 16.0 15.1 13.2  HCT 48.6 47.0 44.0 39.9  PLT 229  --  233 169    Coag's Recent Labs  Lab 12/24/2017 1640 12/27/2017 1900 12/26/17 0147  APTT 26 27 59*  INR 1.03 1.03 1.07    Sepsis Markers Recent Labs  Lab 01/01/2018 1656 01/15/2018 2101  LATICACIDVEN 8.40* 2.6*    ABG Recent Labs  Lab 12/26/17 1545 12/26/17 2004 12/27/17 0258  PHART 7.230* 7.287* 7.360  PCO2ART 35.9 27.3* 27.2*  PO2ART 74.9* 61.0* 66.0*    Liver Enzymes Recent Labs  Lab 01/15/2018 1640  AST 45*  ALT 29  ALKPHOS 71  BILITOT 0.9  ALBUMIN 2.9*  Cardiac Enzymes Recent Labs  Lab Jan 01, 2018 2154 12/26/17 0343 12/26/17 1125  TROPONINI 0.48* 0.31* 0.20*    Glucose Recent Labs  Lab 12/27/17 1209 12/27/17 1617 12/27/17 1946 12/30/2017 0007 01/07/2018 0414 01/10/2018 0909  GLUCAP 138* 123* 143* 108* 124* 110*    Imaging Ct Head Wo Contrast  Result Date: 12/27/2017 CLINICAL DATA:  Cardiac arrest.  Rule out anoxic injury EXAM: CT HEAD WITHOUT CONTRAST TECHNIQUE: Contiguous axial images were obtained from the base of the skull through the vertex without intravenous contrast. COMPARISON:  CT head 2018/01/01 FINDINGS: Brain: Ventricle size is normal. Normal cerebral volume for age. Negative for hemorrhage mass or fluid collection. No significant effacement of the subarachnoid space compared to the prior study. No focal infarct. Vascular: Negative for hyperdense vessel Skull: Negative Sinuses/Orbits: Mucosal edema paranasal sinuses. The patient is intubated.  Other: None IMPRESSION: No change from the prior study. No focal infarct or hemorrhage. Global anoxic injury difficult to exclude based on CT. Electronically Signed   By: Marlan Palau M.D.   On: 12/27/2017 11:45   Dg Chest Portable 1 View  Result Date: 01/03/2018 CLINICAL DATA:  Respiratory failure EXAM: PORTABLE CHEST 1 VIEW COMPARISON:  December 27, 2017 FINDINGS: The ETT is in good position. The NG tube terminates below today's film. No pneumothorax. Transcutaneous pacer obscures the left-sided chest. Stable cardiomegaly. The hila and mediastinum are unchanged. No overt edema or focal infiltrate. IMPRESSION: 1. Support apparatus as above. 2. No other acute abnormalities identified. Electronically Signed   By: Gerome Sam III M.D   On: 01/16/2018 08:37     STUDIES:  Ct head >>> neg C spine CT >> spondylosis EEG 4/6>>>Clinical interpretation at this 22 hours of intensive EEG monitoring with simultaneous video monitoring for this patient with cardiac arrest and cerebral anoxia and marked by severe encephalopathy likely related to cerebral anoxia, seizures and later periodic spike and polyspike and wave discharges.  This finding suggestive of severe encephalopathy and significant cortical irritability.  In setting of cerebral anoxia this findings are suggestive of poor prognostic sign.   CT  Head 4/6>>>No change from the prior study. No focal infarct or hemorrhage. Global anoxic injury difficult to exclude based on CT.  SIGNIFICANT EVENTS: Cardiac arrest 2018-01-01   LINES/TUBES: Right femoral line 4/4  >> Arterial line 4/4 >>  DISCUSSION: Mr Devin Green 67 yr old male with witnessed ventricular fibrillation arrest and acute hypoxemic respiratory failure requiring mechanical ventilation, s/p hypothermia protocol  ASSESSMENT / PLAN:  PULMONARY A: Acute hypoxemic resp failure requiring mechanical ventilation   P:   Continue vent support  CARDIOVASCULAR A:  V fib arrest  Cardiogenic  shock  P:  Levophed gtt for MAP 65 & above   RENAL A:   AKI Hypokalemia  Acute metab acidosis P:   Stop bicarb F/u chem    GASTROINTESTINAL A:   P:   NPO PPI for GI prophylaxis    HEMATOLOGIC A:   Leukocytosis  P:  F/u CBC  DC heparin gtt  INFECTIOUS A:   No signs of infections  P:   Trend WBC, fever curve   ENDOCRINE A:   hyperglycemia P:   Insulin gtt off   NEUROLOGIC A:   Acute metabolic encephalopathy  Prolonged downtime >> likely anoxic injury  P:   Discussed at length with neurology-patient with dismal neuro exam, dismal EEG.  Started back on low-dose Versed overnight for twitching/?  Myoclonus.  Dr. Amada Jupiter discussed at length with family the patient's anoxic  brain injury and Grim prognosis.  Discussed with family and neuro today. Will change to DNR and focus on comfort. Continue vent and levophed for now. Plan on withdrawal of care when the family has a chance to visit and say goodbye. Likely terminal extubation on 4/8  FAMILY  - Updates: Family updated - Inter-disciplinary family meet or Palliative Care meeting due by:  12/31/2017  The patient is critically ill with multiple organ system failure and requires high complexity decision making for assessment and support, frequent evaluation and titration of therapies, advanced monitoring, review of radiographic studies and interpretation of complex data.   Critical Care Time devoted to patient care services, exclusive of separately billable procedures, described in this note is 35 minutes.   Chilton Greathouse MD [facility] Pager [phone] If no answer or after 3pm call: [phone] 01-03-18, 9:44 AM

## 2018-01-21 NOTE — Progress Notes (Signed)
Pt noted to have copious amounts of gastric contents coming out of side of mouth.  Tube feeding immediately turned off.  Gastric contents continue to be suctioned out of OGT.  Dr. Cherly BeachSomers notified of event.  Will continue to monitor closely.

## 2018-01-21 NOTE — Discharge Summary (Addendum)
Death Summary  Patient ID: Devin SheldonRobert A Clemmons Jr MRN: [id number] DOB/AGE: 03-09-1951 67 y.o.  Admit date: 01/09/2018 Discharge date: 12/22/2017  Admission Diagnoses: V. fib cardiac arrest  Discharge Diagnoses:  Active Problems:   Ventricular fibrillation (HCC)   Diabetes (HCC)   Cardiac arrest (HCC)   Anoxic brain injury [facility](HCC)   Discharged Condition: Deceased  Hospital Course:  67 yr old male with no available past history coming in after witnessed cardiac arrest. He was working in the yard with his family were he collapsed and CPR started immediately and when EMS arrived he was shocked twice. He was down for around 23mins before ROSC.  EKG showed ST elevation and AVR and anterolateral ST depression/TWI's - ?LM or proximal LAD disease.  Seen by cardiology who deferred cardiac cath.  He underwent hypothermia protocol Upon rewarming he had poor neurologic status.  Neurology service consulted with EEG and CT scan showing anoxic brain injury. Upon discussion with family he was transitioned to comfort care with terminal withdrawal on 01/11/2018  Consults: Cardiology, neurology  Signed: Lyra Alaimo 01/01/2018, 5:10 PM

## 2018-01-21 NOTE — Progress Notes (Signed)
100 cc of Fentanyl and 40 of versed wasted in sink. Witnessed by 2RN's, Traci G & Anderson MaltaJessica M. [place], [name]

## 2018-01-21 NOTE — Progress Notes (Signed)
Pt extubated per withdrawal order.  Family at bedside. 

## 2018-01-21 NOTE — Progress Notes (Signed)
Family has decided to remove supportive care.  RT notified. [name], [name]

## 2018-01-21 NOTE — Progress Notes (Signed)
Subjective: Began having some subtle twitching overnight. Was started on fentanyl/versed  Exam: Vitals:   05/27/2018 0500 05/27/2018 0600  BP: 116/71 114/75  Pulse:    Resp: (!) 22 (!) 22  Temp:  98.4 F (36.9 C)  SpO2: 96% 96%   Gen: In bed, intubated Resp: Ventilated Abd: soft, nt  Neuro:  MS: Does not open eyes or follow commands CN: Pupils non-reactive, corneals absent Motor: No response to noxious stimuli, occasional clonic movement.   Pertinent Labs: Cr 1.21  Impression: 67 year old male with anoxic brain injury status post cardiac arrest.  The pattern seen on EEG is very concerning for severe anoxic brain injury.  Initially, though consistent with status epilepticus given the lack of return to baseline between seizures, there was a very prolonged period between seizures up to 25 minutes with completely flat background, and this is prior to high-dose sedatives which would confound this.  The actual evolving seizure activity was initially controlled with antiepileptics and sedatives, but this gave way to GPEDS. With weaning sedation, there are bursts of very high amplitude generalized discharges without evolution which can be seen in post-anoxic injury.   Given the dismal exam and EEG findings I do nto think that he has a significant chance of recover  Recommendations: 1) Recommend palliative care 2) if abnormal movements become problematic, could use higher dose of Versed, but I do not expect this will improve the outcome 3) no further recommendations at this time, we are available to continue helping with family discussions, please call with further questions or concerns.  Ritta SlotMcNeill Aviya Jarvie, MD [facility] [phone]  If 7pm- 7am, please page neurology on call as listed in AMION. 01/15/2018  7:58 AM

## 2018-01-21 NOTE — Progress Notes (Signed)
Darnelle GoingStephanie Marsh RN witnessed Versed 30ml and Fentanyl 20ml wasted in sink.

## 2018-01-21 NOTE — Procedures (Signed)
lectroencephalogram report- LTM   Data acquisition: 10-20 electrode placement.  Additional T1, T2, and EKG electrodes; 26 channel digital referential acquisition reformatted to 18 channel/7 channel coronal bipolar     Spike detection: ON     Seizure detection: ON   Beginning time: 12/27/17 at 8 45 am  Ending time: 01/05/2018 at 07 44  am   CPT: 95951 Day of study: day 2  This intensive EEG monitoring with simultaneous video monitoring was performed for this patient with cardiac arrest and anoxia to rule out seizures.  Medications: as per EMR, intubated    Day 1 : During first half of the recording background activities marked by suppressed background activities with superimposed electrographic seizures without obvious clinical accompaniment.  As recording progress electrographic seizures resolved leaving background activities marked by burst and suppression pattern.  Burst of mixed frequencies less than 1-3 seconds alternating with 3-5 seconds of suppressed background activities.  Superimposed near continuous periodic spike and polyspike and wave discharges present throughout the second half of the recording.  Day 2: Burst suppression with 2/2 and 3/2  ration. Bursts almost completely c/o generalized fast polyspike discharges with some degree of evolution suggestive of brief electrographic seizures.   Clinical interpretation :  this day 2  of intensive EEG monitoring with simultaneous video monitoring suggestive of severe encephalopathy and significant cortical irritability.  In setting of cerebral anoxia these  findings are suggestive of poor prognostic sign.  Clinical correlation is advised.

## 2018-01-21 NOTE — Progress Notes (Signed)
Notified neurology on call re: if ok to restart sedation since pt stacking breaths.  Ok to restart fentanyl and versed per Dr. Wilford CornerArora.  Will continue to monitor closely.

## 2018-01-21 NOTE — Progress Notes (Signed)
   01/06/2018 1342  Clinical Encounter Type  Visited With Patient and family together;Health care provider  Visit Type Death  Referral From Nurse  Consult/Referral To Chaplain  Spiritual Encounters  Spiritual Needs Emotional;Grief support;Prayer  Stress Factors  Family Stress Factors Loss   Was responding to a SCC for EOL.  Patient had died.  Spoke with nurse to learn more about the family.  Today's is patient's wife's birthday.  Patient was surrounded by lots of family and we prayed together.  Family remains at bedside to spend time together.  Will follow and support as needed. Chaplain Agustin CreeNewton Ruie Sendejo

## 2018-01-21 NOTE — Progress Notes (Addendum)
Patient asystole on monitor. No breath sounds or heart tones auscultated by 2 RN's for 1 minute, CDW CorporationJessica Fallou Hulbert and [employer] Gregory.  Time of death 13:24 & MD notified. Hand prints made for family and support offered. [place], [name]

## 2018-01-21 DEATH — deceased

## 2019-09-03 IMAGING — DX DG CHEST 1V PORT
1 series · 2 of 2 positions shown · non-contrast
Comparison: None.

CLINICAL DATA: 66 y/o  M; post cardiac arrest.  ET tube placement.

EXAM:
PORTABLE CHEST 1 VIEW

[Series 1: chest · 0.14mm/px · 2 of 2 slices shown]
[im 1/2]
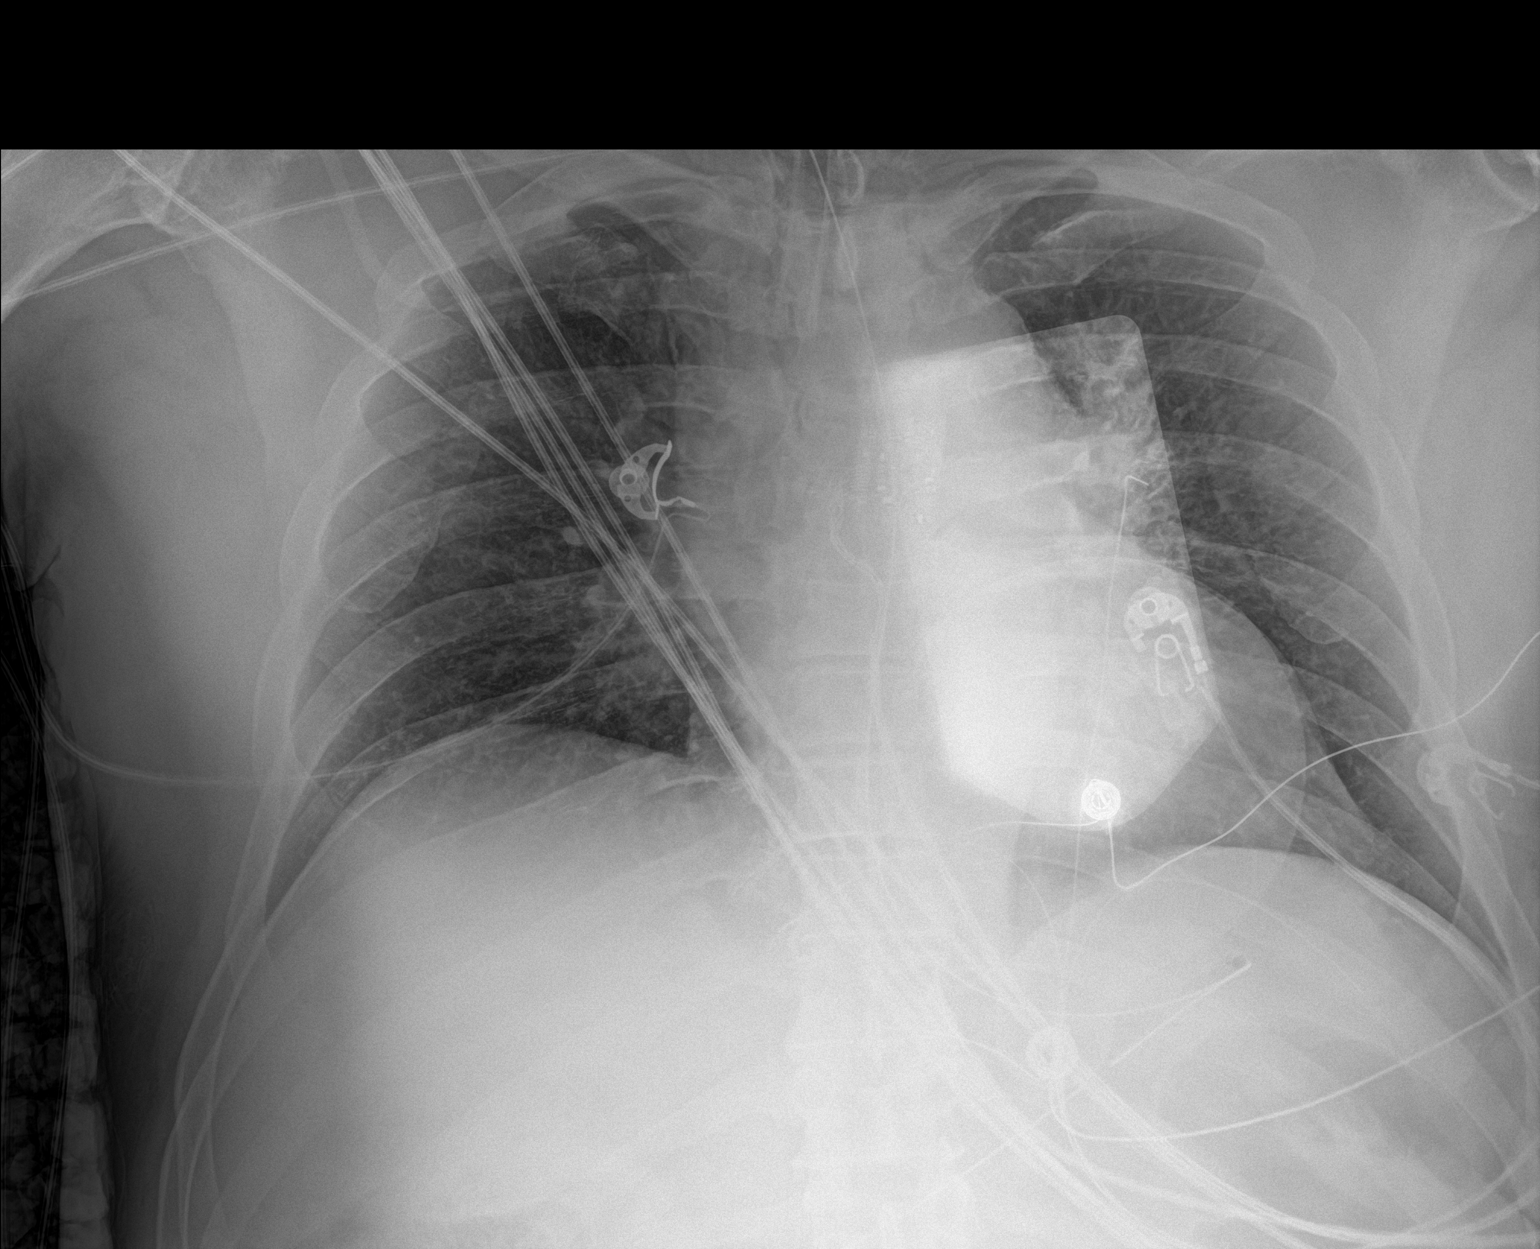
[im 2/2]
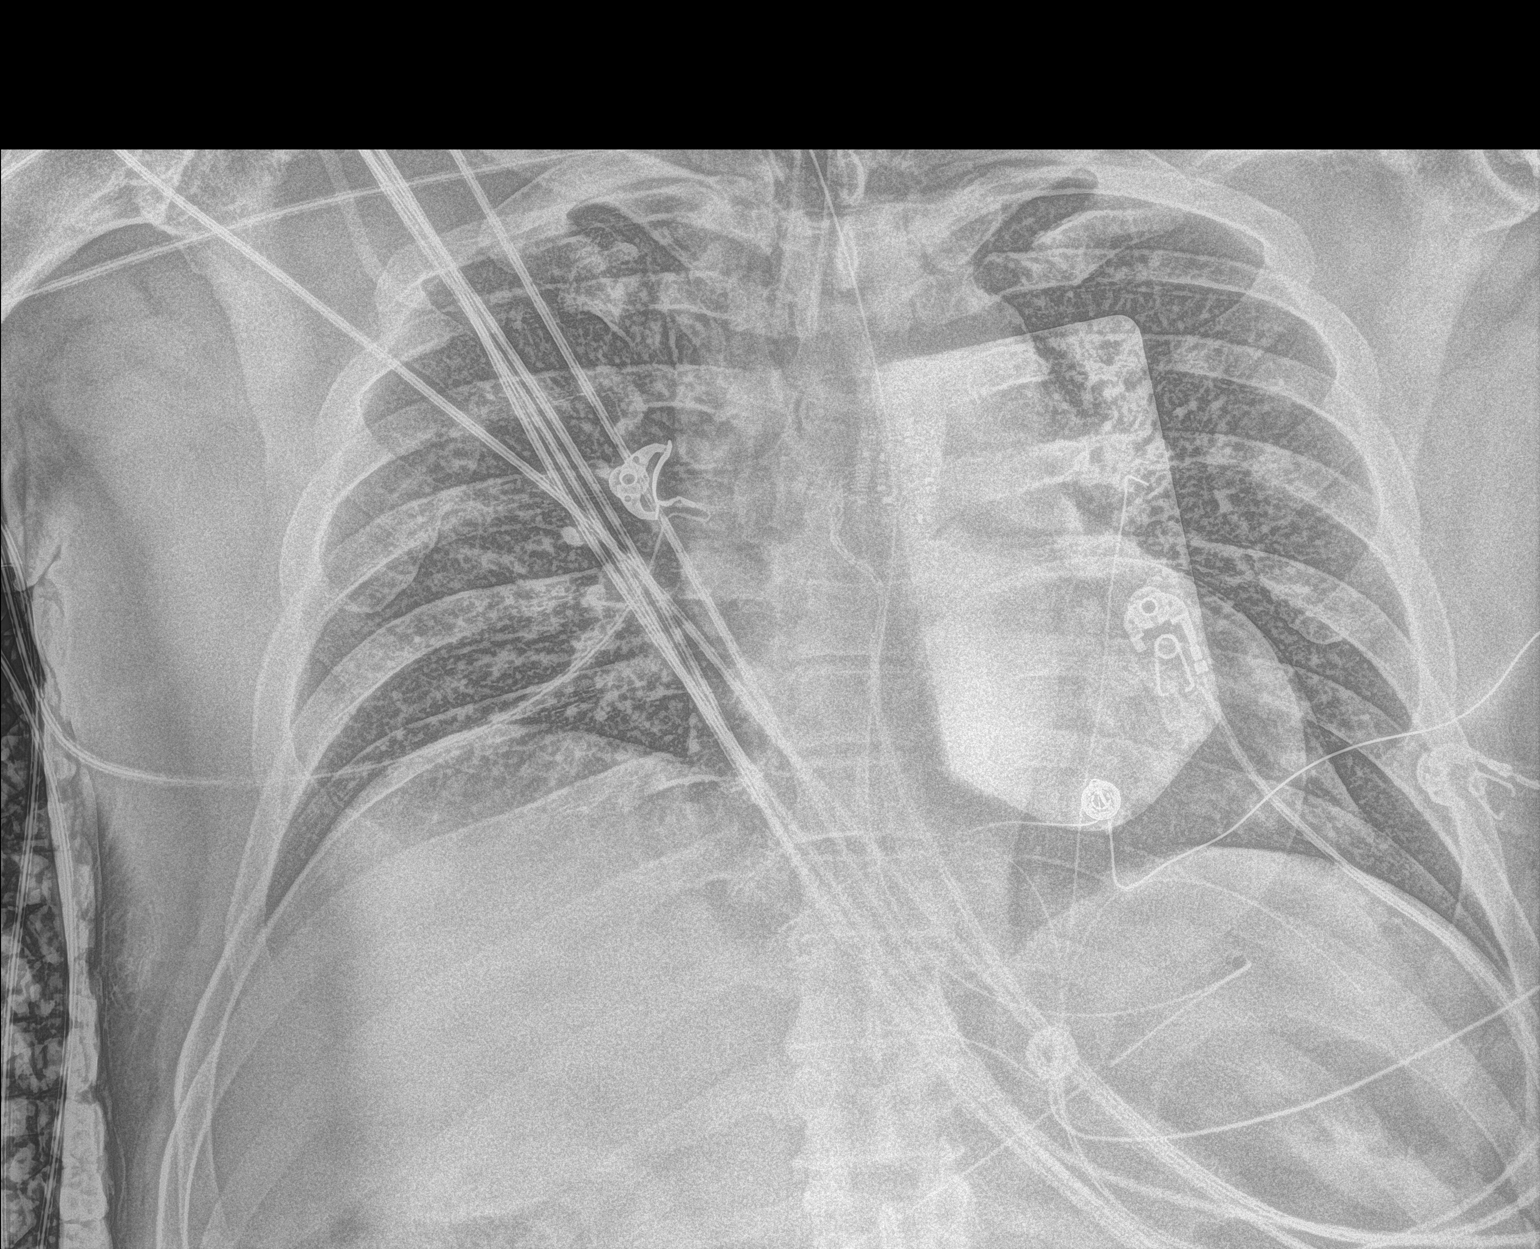

[2 of 2 positions shown; findings below may reference images not displayed]

FINDINGS: Stable cardiac silhouette given projection and technique. Low lung
volumes. No consolidation, effusion, or pneumothorax identified.
Endotracheal tube tip projects 3.7 cm above the carina. Left lung
partially obscured by transcutaneous pacing pads. No acute osseous
abnormality identified.
IMPRESSION: Endotracheal tube 3.7 cm above the carina. Low lung volumes. No
acute pulmonary process identified.

By: Ben Mefteh Wnisi M.D.

## 2019-09-05 IMAGING — DX DG CHEST 1V PORT
1 series · 1 of 1 positions shown · non-contrast
Comparison: 12/25/2017

CLINICAL DATA: Acute respiratory failure

EXAM:
PORTABLE CHEST 1 VIEW

[chest ap]
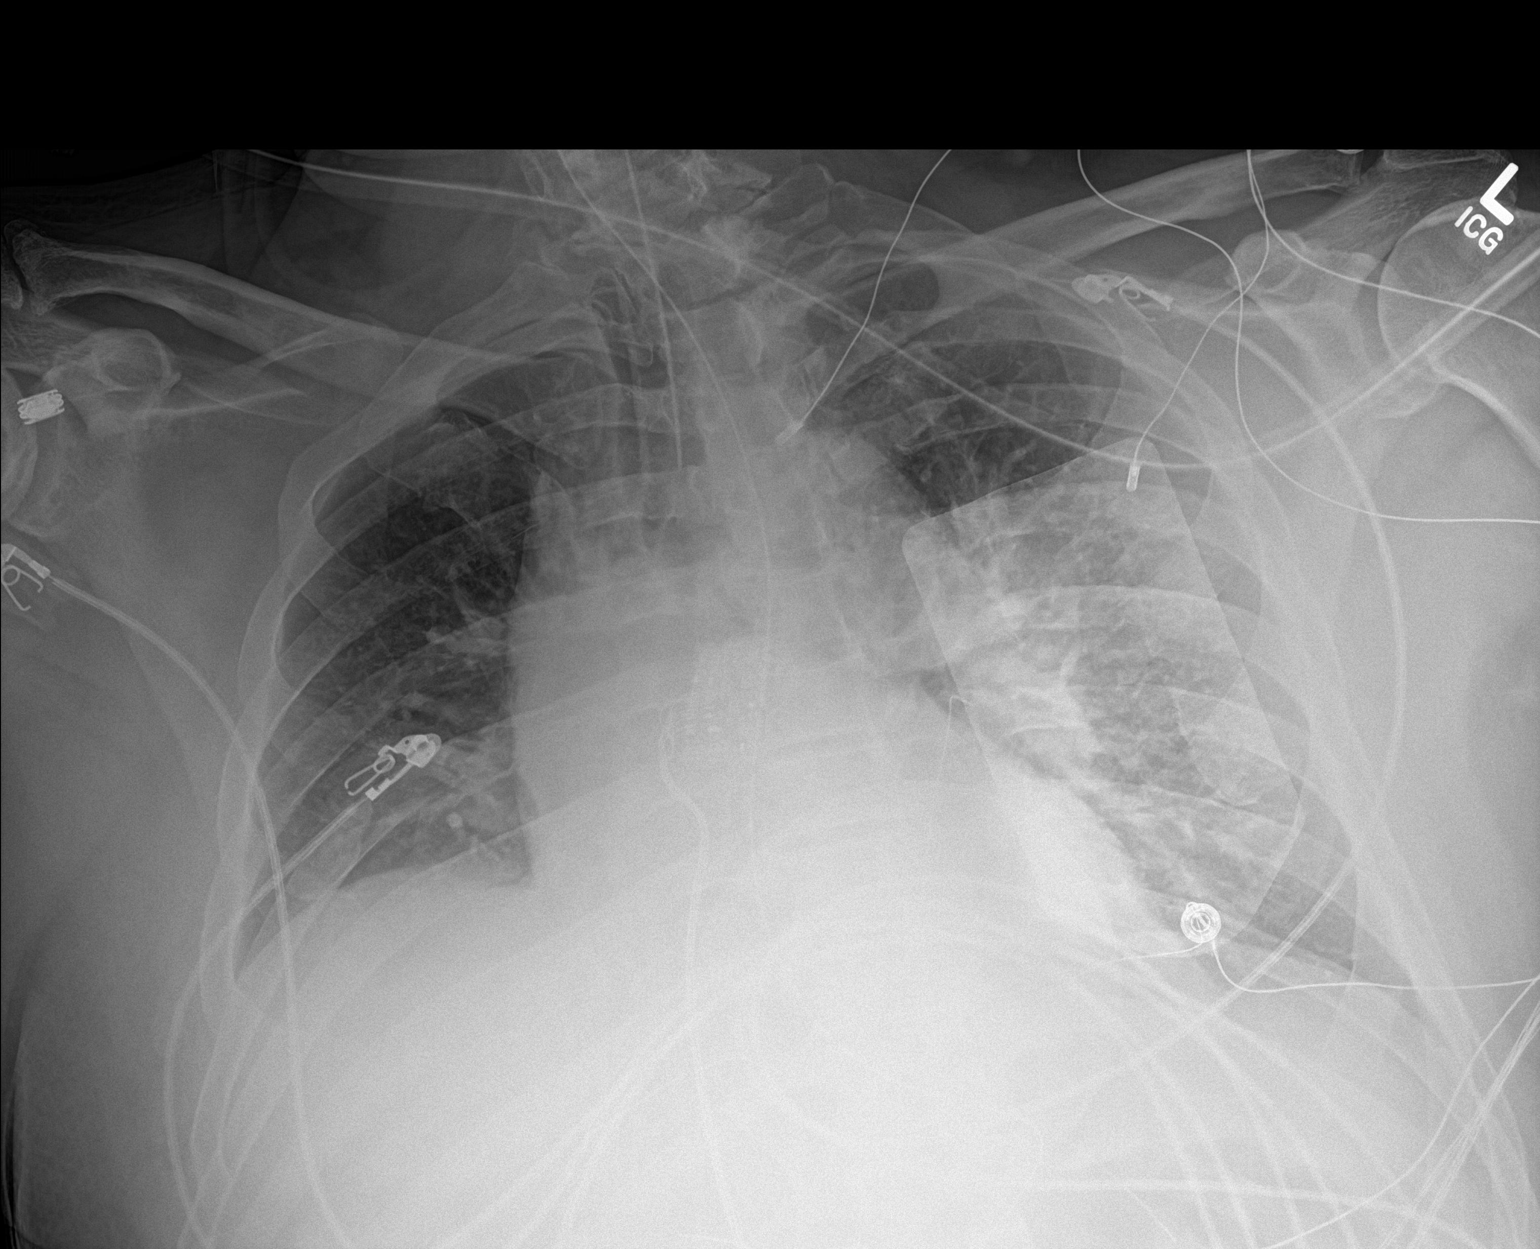

[1 of 1 positions shown; findings below may reference images not displayed]

FINDINGS: Endotracheal tube in good position.  NG tube in the stomach.

Progression of right lower lobe collapse since the prior study.
Possible occlusion of right lower lobe bronchus. Progression of left
lower lobe atelectasis. Negative for edema or effusion
IMPRESSION: Progression of bibasilar atelectasis right greater than left.

## 2019-09-05 IMAGING — CT CT HEAD W/O CM
4 series · 15 of 47 positions shown, 17 images · non-contrast
Comparison: CT head 12/25/2017

CLINICAL DATA: Cardiac arrest.  Rule out anoxic injury

EXAM:
CT HEAD WITHOUT CONTRAST
TECHNIQUE: Contiguous axial images were obtained from the base of the skull
through the vertex without intravenous contrast.

[Series 3: head wo · axial · 0.58mm/px · z∈[-139,+1]mm · 7 of 38 slices shown, 9 images]
[im 5/38  brain]
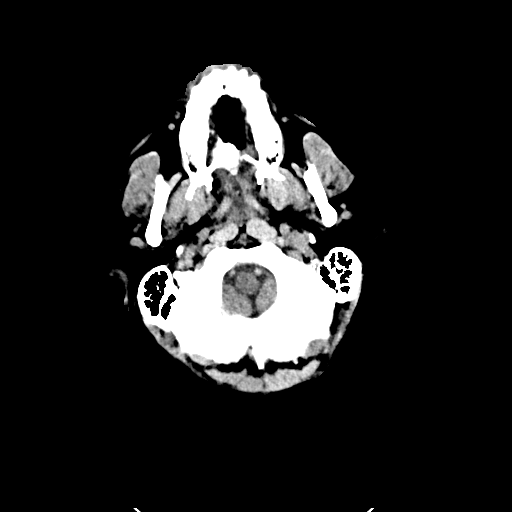
[im 5/38  bone]
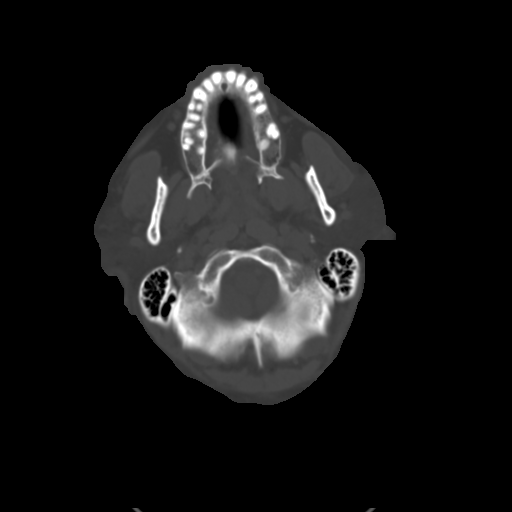
[im 10/38  brain]
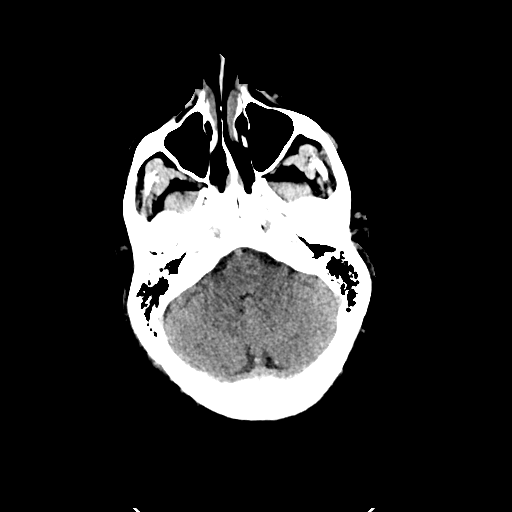
[im 14/38  brain]
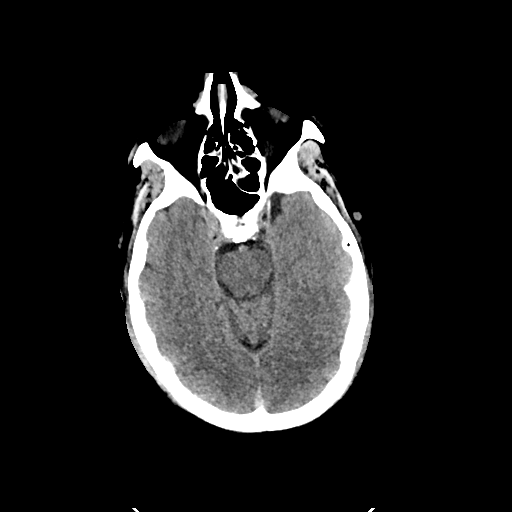
[im 19/38  brain]
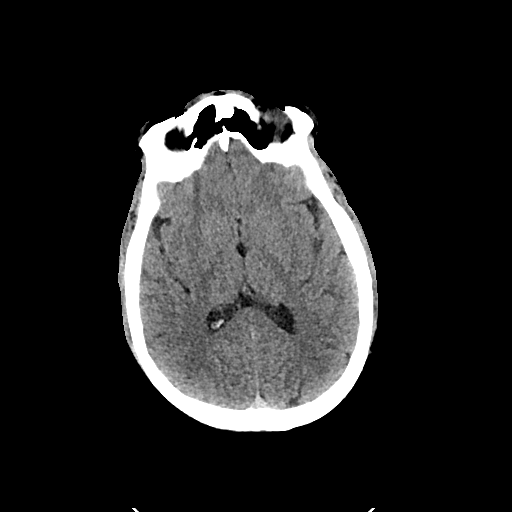
[im 24/38  brain]
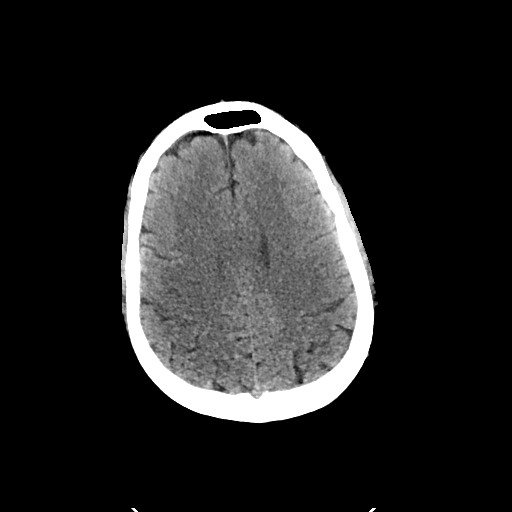
[im 24/38  bone]
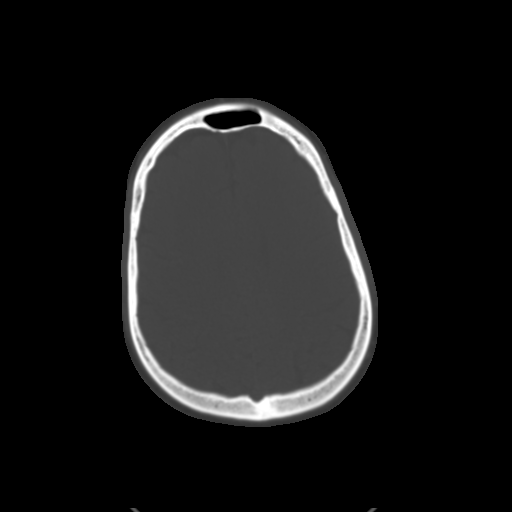
[im 28/38  brain]
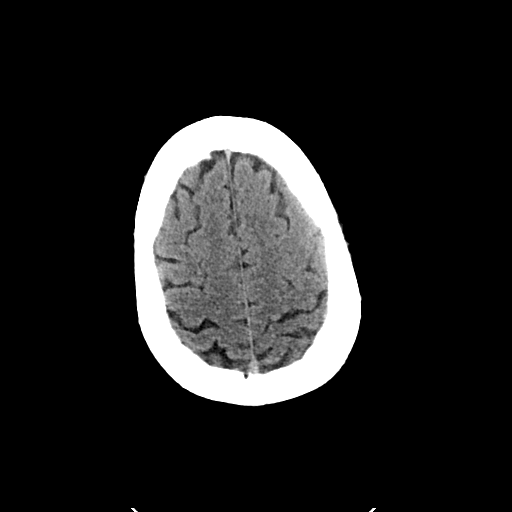
[im 33/38  brain]
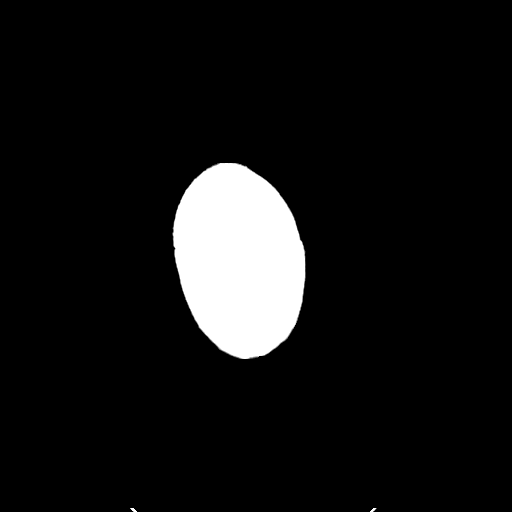

[Series 4: head bone · axial · 0.58mm/px · z∈[-141,-123]mm · 2 of 93 slices shown]
[im 10/93  bone]
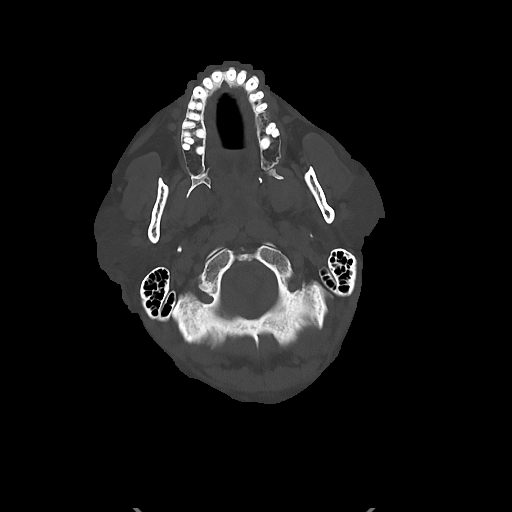
[im 19/93  bone]
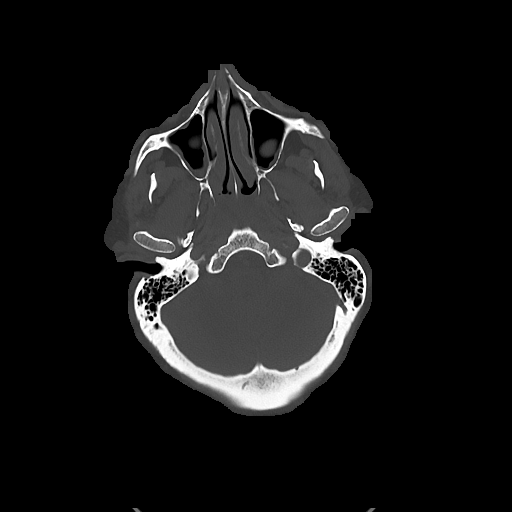

[Series 5: cor soft · coronal · 0.36mm/px · 3 of 85 slices shown]
[im 29/85  brain]
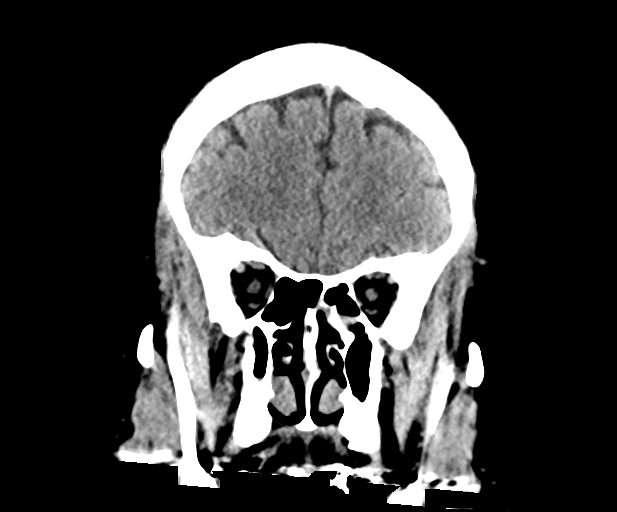
[im 38/85  brain]
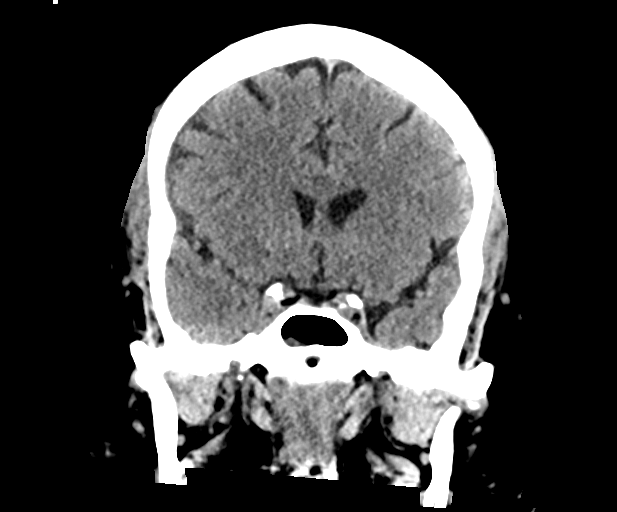
[im 47/85  brain]
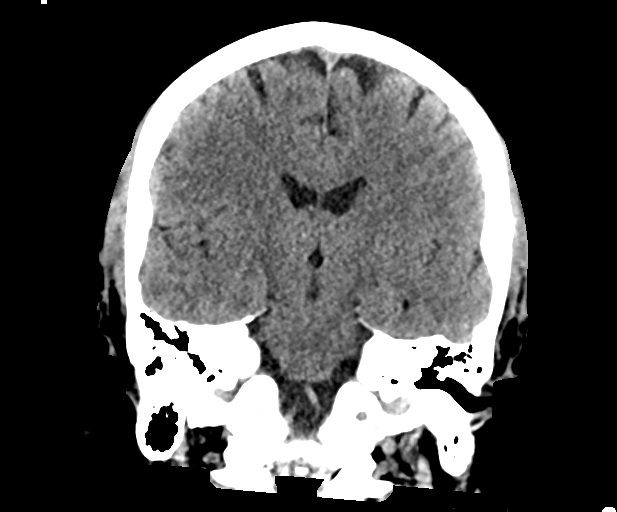

[Series 6: sag soft · sagittal · 0.37mm/px · 3 of 67 slices shown]
[im 23/67  brain]
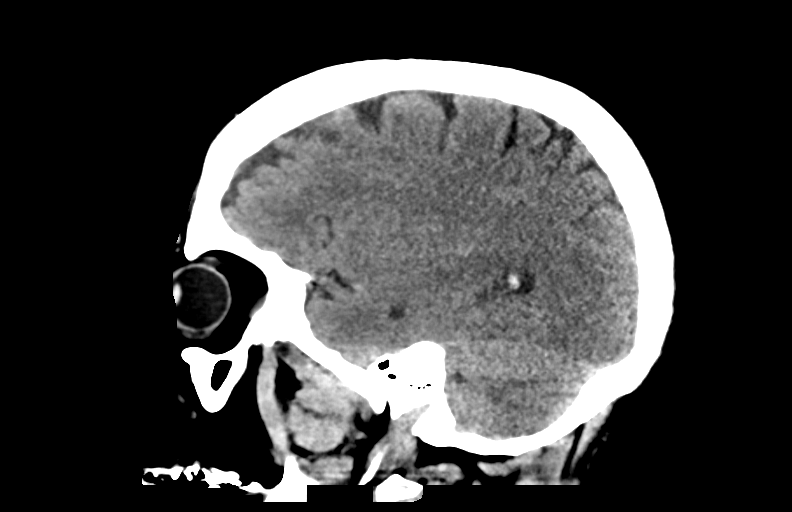
[im 34/67  brain]
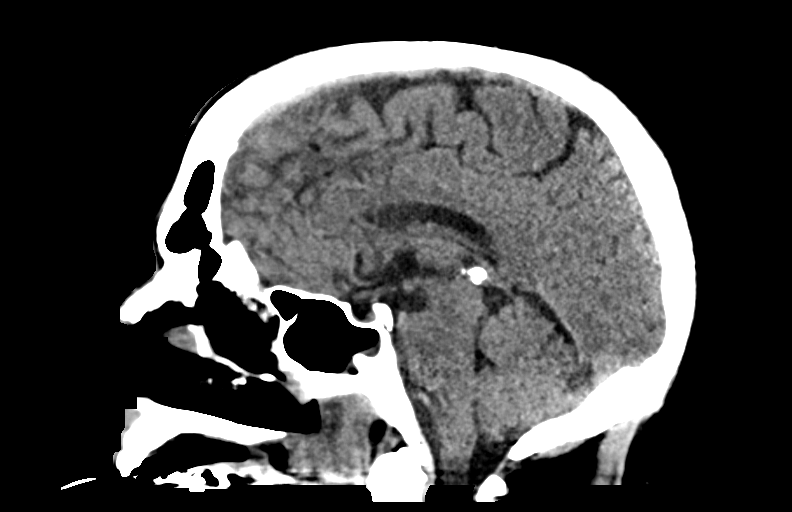
[im 45/67  brain]
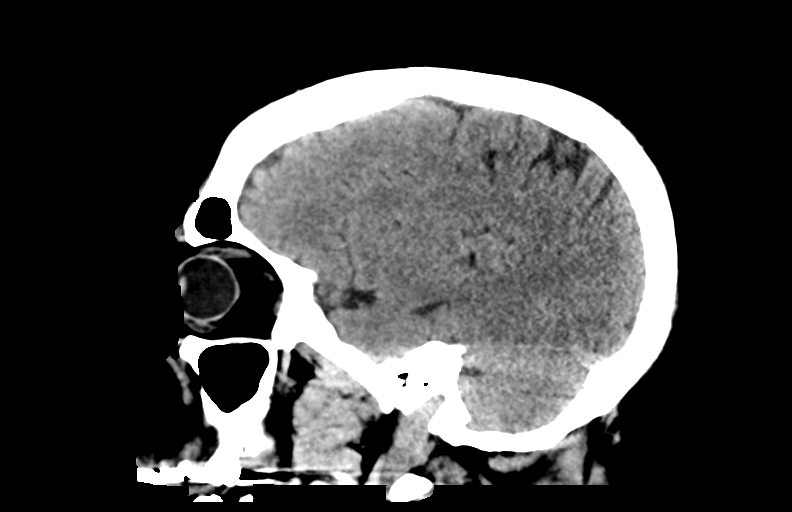

[15 of 47 positions shown; findings below may reference images not displayed]

FINDINGS: Brain: Ventricle size is normal. Normal cerebral volume for age.
Negative for hemorrhage mass or fluid collection. No significant
effacement of the subarachnoid space compared to the prior study. No
focal infarct.

Vascular: Negative for hyperdense vessel

Skull: Negative

Sinuses/Orbits: Mucosal edema paranasal sinuses. The patient is
intubated.

Other: None
IMPRESSION: No change from the prior study. No focal infarct or hemorrhage.
Global anoxic injury difficult to exclude based on CT.
